# Patient Record
Sex: Male | Born: 1975 | Race: White | Hispanic: No | Marital: Married | Smoking: Never smoker
Health system: Southern US, Community
[De-identification: ages and names within clinical notes are randomized; demographics above are authoritative.]

## PROBLEM LIST (undated history)

## (undated) DIAGNOSIS — T7500XA Unspecified effects of lightning, initial encounter: Secondary | ICD-10-CM

## (undated) DIAGNOSIS — I10 Essential (primary) hypertension: Secondary | ICD-10-CM

## (undated) DIAGNOSIS — C439 Malignant melanoma of skin, unspecified: Secondary | ICD-10-CM

## (undated) DIAGNOSIS — E785 Hyperlipidemia, unspecified: Secondary | ICD-10-CM

## (undated) DIAGNOSIS — T7840XA Allergy, unspecified, initial encounter: Secondary | ICD-10-CM

## (undated) DIAGNOSIS — G40909 Epilepsy, unspecified, not intractable, without status epilepticus: Secondary | ICD-10-CM

## (undated) HISTORY — DX: Unspecified effects of lightning, initial encounter: T75.00XA

## (undated) HISTORY — DX: Hyperlipidemia, unspecified: E78.5

## (undated) HISTORY — DX: Allergy, unspecified, initial encounter: T78.40XA

## (undated) HISTORY — DX: Epilepsy, unspecified, not intractable, without status epilepticus: G40.909

## (undated) HISTORY — PX: TYMPANOSTOMY TUBE PLACEMENT: SHX32

## (undated) HISTORY — DX: Malignant melanoma of skin, unspecified: C43.9

## (undated) HISTORY — DX: Essential (primary) hypertension: I10

---

## 1999-04-16 HISTORY — PX: OTHER SURGICAL HISTORY: SHX169

## 2004-07-11 ENCOUNTER — Ambulatory Visit: Payer: Self-pay | Admitting: Internal Medicine

## 2004-07-11 ENCOUNTER — Ambulatory Visit: Payer: Self-pay | Admitting: Family Medicine

## 2004-07-16 ENCOUNTER — Encounter (INDEPENDENT_AMBULATORY_CARE_PROVIDER_SITE_OTHER): Payer: Self-pay | Admitting: Internal Medicine

## 2004-07-16 DIAGNOSIS — E119 Type 2 diabetes mellitus without complications: Secondary | ICD-10-CM

## 2004-07-16 LAB — CONVERTED CEMR LAB: Hgb A1c MFr Bld: 6.6 %

## 2004-07-21 ENCOUNTER — Ambulatory Visit: Payer: Self-pay | Admitting: Family Medicine

## 2004-09-27 ENCOUNTER — Ambulatory Visit: Payer: Self-pay | Admitting: Family Medicine

## 2004-12-14 ENCOUNTER — Encounter (INDEPENDENT_AMBULATORY_CARE_PROVIDER_SITE_OTHER): Payer: Self-pay | Admitting: Internal Medicine

## 2004-12-14 LAB — CONVERTED CEMR LAB: Hgb A1c MFr Bld: 6 %

## 2005-01-10 ENCOUNTER — Ambulatory Visit: Payer: Self-pay | Admitting: Family Medicine

## 2005-02-16 ENCOUNTER — Ambulatory Visit: Payer: Self-pay | Admitting: Family Medicine

## 2005-03-28 ENCOUNTER — Ambulatory Visit: Payer: Self-pay | Admitting: Family Medicine

## 2005-04-05 ENCOUNTER — Ambulatory Visit: Payer: Self-pay | Admitting: Family Medicine

## 2005-04-06 ENCOUNTER — Emergency Department (HOSPITAL_COMMUNITY): Admission: EM | Admit: 2005-04-06 | Discharge: 2005-04-06 | Payer: Self-pay | Admitting: Emergency Medicine

## 2005-04-08 ENCOUNTER — Emergency Department (HOSPITAL_COMMUNITY): Admission: EM | Admit: 2005-04-08 | Discharge: 2005-04-08 | Payer: Self-pay | Admitting: Family Medicine

## 2005-04-20 ENCOUNTER — Ambulatory Visit: Payer: Self-pay | Admitting: Family Medicine

## 2005-06-04 ENCOUNTER — Ambulatory Visit: Payer: Self-pay | Admitting: Internal Medicine

## 2005-08-16 ENCOUNTER — Encounter (INDEPENDENT_AMBULATORY_CARE_PROVIDER_SITE_OTHER): Payer: Self-pay | Admitting: Internal Medicine

## 2005-08-16 LAB — CONVERTED CEMR LAB: Hgb A1c MFr Bld: 6.1 %

## 2005-08-20 ENCOUNTER — Ambulatory Visit: Payer: Self-pay | Admitting: Family Medicine

## 2005-12-07 ENCOUNTER — Ambulatory Visit: Payer: Self-pay | Admitting: Family Medicine

## 2005-12-20 ENCOUNTER — Ambulatory Visit: Payer: Self-pay | Admitting: Internal Medicine

## 2006-02-18 ENCOUNTER — Encounter (INDEPENDENT_AMBULATORY_CARE_PROVIDER_SITE_OTHER): Payer: Self-pay | Admitting: Internal Medicine

## 2006-02-18 ENCOUNTER — Ambulatory Visit: Payer: Self-pay | Admitting: Family Medicine

## 2006-02-18 LAB — CONVERTED CEMR LAB: Hgb A1c MFr Bld: 6.2 %

## 2006-05-30 ENCOUNTER — Ambulatory Visit: Payer: Self-pay | Admitting: Family Medicine

## 2006-06-15 ENCOUNTER — Encounter (INDEPENDENT_AMBULATORY_CARE_PROVIDER_SITE_OTHER): Payer: Self-pay | Admitting: Internal Medicine

## 2006-06-15 LAB — CONVERTED CEMR LAB: Microalbumin U total vol: 2.1 mg/L

## 2006-06-28 ENCOUNTER — Ambulatory Visit: Payer: Self-pay | Admitting: Family Medicine

## 2006-07-08 ENCOUNTER — Ambulatory Visit: Payer: Self-pay | Admitting: Internal Medicine

## 2006-11-15 ENCOUNTER — Encounter (INDEPENDENT_AMBULATORY_CARE_PROVIDER_SITE_OTHER): Payer: Self-pay | Admitting: Internal Medicine

## 2006-11-15 DIAGNOSIS — Z8669 Personal history of other diseases of the nervous system and sense organs: Secondary | ICD-10-CM

## 2006-11-28 ENCOUNTER — Ambulatory Visit: Payer: Self-pay | Admitting: Family Medicine

## 2006-11-28 ENCOUNTER — Telehealth (INDEPENDENT_AMBULATORY_CARE_PROVIDER_SITE_OTHER): Payer: Self-pay | Admitting: *Deleted

## 2006-11-28 DIAGNOSIS — E782 Mixed hyperlipidemia: Secondary | ICD-10-CM | POA: Insufficient documentation

## 2006-11-29 LAB — CONVERTED CEMR LAB
ALT: 85 units/L — ABNORMAL HIGH (ref 0–40)
AST: 42 units/L — ABNORMAL HIGH (ref 0–37)
Cholesterol: 143 mg/dL (ref 0–200)
HDL: 37.2 mg/dL — ABNORMAL LOW (ref >39.0)
Hgb A1c MFr Bld: 7.2 % — ABNORMAL HIGH (ref 4.6–6.0)
LDL Cholesterol: 81 mg/dL (ref 0–99)
Total CHOL/HDL Ratio: 3.8
Triglycerides: 124 mg/dL (ref 0–149)
VLDL: 25 mg/dL (ref 0–40)

## 2006-12-20 ENCOUNTER — Ambulatory Visit: Payer: Self-pay | Admitting: Family Medicine

## 2006-12-27 ENCOUNTER — Ambulatory Visit: Payer: Self-pay | Admitting: Family Medicine

## 2007-01-01 LAB — CONVERTED CEMR LAB
Cholesterol: 126 mg/dL (ref 0–200)
HDL: 35.3 mg/dL — ABNORMAL LOW (ref >39.0)
LDL Cholesterol: 71 mg/dL (ref 0–99)
Total CHOL/HDL Ratio: 3.6
Triglycerides: 100 mg/dL (ref 0–149)
VLDL: 20 mg/dL (ref 0–40)

## 2007-02-27 ENCOUNTER — Ambulatory Visit: Payer: Self-pay | Admitting: Internal Medicine

## 2007-05-30 ENCOUNTER — Ambulatory Visit: Payer: Self-pay | Admitting: Family Medicine

## 2007-07-01 ENCOUNTER — Ambulatory Visit: Payer: Self-pay | Admitting: Family Medicine

## 2007-07-07 ENCOUNTER — Telehealth (INDEPENDENT_AMBULATORY_CARE_PROVIDER_SITE_OTHER): Payer: Self-pay | Admitting: Internal Medicine

## 2007-07-07 LAB — CONVERTED CEMR LAB
AST: 24 units/L (ref 0–37)
Cholesterol: 137 mg/dL (ref 0–200)
Creatinine,U: 191 mg/dL
LDL Cholesterol: 75 mg/dL (ref 0–99)
Microalb Creat Ratio: 3.7 mg/g (ref 0.0–30.0)
Microalb, Ur: 0.7 mg/dL (ref 0.0–1.9)
VLDL: 22 mg/dL (ref 0–40)

## 2007-08-26 ENCOUNTER — Ambulatory Visit: Payer: Self-pay | Admitting: Family Medicine

## 2007-11-17 ENCOUNTER — Ambulatory Visit: Payer: Self-pay | Admitting: Family Medicine

## 2007-11-19 LAB — CONVERTED CEMR LAB
ALT: 28 units/L (ref 0–53)
AST: 23 units/L (ref 0–37)
Calcium: 9.6 mg/dL (ref 8.4–10.5)
Creatinine, Ser: 0.9 mg/dL (ref 0.4–1.5)
GFR calc Af Amer: 124 mL/min
GFR calc non Af Amer: 103 mL/min
HDL: 39.7 mg/dL (ref >39.0)
LDL Cholesterol: 73 mg/dL (ref 0–99)
Sodium: 140 meq/L (ref 135–145)
Total CHOL/HDL Ratio: 3.2
VLDL: 16 mg/dL (ref 0–40)

## 2007-12-03 ENCOUNTER — Encounter (INDEPENDENT_AMBULATORY_CARE_PROVIDER_SITE_OTHER): Payer: Self-pay | Admitting: Internal Medicine

## 2007-12-22 ENCOUNTER — Ambulatory Visit: Payer: Self-pay | Admitting: Family Medicine

## 2008-02-24 ENCOUNTER — Ambulatory Visit: Payer: Self-pay | Admitting: Family Medicine

## 2008-05-18 ENCOUNTER — Ambulatory Visit: Payer: Self-pay | Admitting: Family Medicine

## 2008-05-20 LAB — CONVERTED CEMR LAB
AST: 19 units/L (ref 0–37)
Alkaline Phosphatase: 32 units/L — ABNORMAL LOW (ref 39–117)
LDL Cholesterol: 78 mg/dL (ref 0–99)
Total Bilirubin: 0.7 mg/dL (ref 0.3–1.2)
Total CHOL/HDL Ratio: 3.3

## 2008-06-23 ENCOUNTER — Ambulatory Visit: Payer: Self-pay | Admitting: Family Medicine

## 2008-06-24 LAB — CONVERTED CEMR LAB
Hgb A1c MFr Bld: 6.4 % — ABNORMAL HIGH (ref 4.6–6.0)
Microalb Creat Ratio: 3.2 mg/g (ref 0.0–30.0)

## 2008-12-28 ENCOUNTER — Ambulatory Visit: Payer: Self-pay | Admitting: Family Medicine

## 2008-12-30 ENCOUNTER — Telehealth (INDEPENDENT_AMBULATORY_CARE_PROVIDER_SITE_OTHER): Payer: Self-pay | Admitting: Internal Medicine

## 2008-12-30 LAB — CONVERTED CEMR LAB
AST: 24 units/L (ref 0–37)
CO2: 29 meq/L (ref 19–32)
Calcium: 9.6 mg/dL (ref 8.4–10.5)
Chloride: 106 meq/L (ref 96–112)
Cholesterol: 146 mg/dL (ref 0–200)
Creatinine, Ser: 1 mg/dL (ref 0.4–1.5)
Direct LDL: 74.6 mg/dL
Glucose, Bld: 127 mg/dL — ABNORMAL HIGH (ref 70–99)
HDL: 38.5 mg/dL — ABNORMAL LOW (ref >39.00)
Sodium: 140 meq/L (ref 135–145)
VLDL: 44.4 mg/dL — ABNORMAL HIGH (ref 0.0–40.0)

## 2009-01-03 ENCOUNTER — Encounter (INDEPENDENT_AMBULATORY_CARE_PROVIDER_SITE_OTHER): Payer: Self-pay | Admitting: Internal Medicine

## 2009-07-07 ENCOUNTER — Ambulatory Visit: Payer: Self-pay | Admitting: Family Medicine

## 2009-07-12 ENCOUNTER — Ambulatory Visit: Payer: Self-pay | Admitting: Family Medicine

## 2009-07-13 ENCOUNTER — Telehealth: Payer: Self-pay | Admitting: Internal Medicine

## 2009-07-13 ENCOUNTER — Encounter (INDEPENDENT_AMBULATORY_CARE_PROVIDER_SITE_OTHER): Payer: Self-pay | Admitting: Internal Medicine

## 2009-07-13 LAB — CONVERTED CEMR LAB
ALT: 51 units/L (ref 0–53)
CO2: 29 meq/L (ref 19–32)
Chloride: 104 meq/L (ref 96–112)
Cholesterol: 136 mg/dL (ref 0–200)
Creatinine,U: 170.6 mg/dL
HDL: 36.7 mg/dL — ABNORMAL LOW (ref >39.00)
LDL Cholesterol: 66 mg/dL (ref 0–99)
Microalb Creat Ratio: 2.3 mg/g (ref 0.0–30.0)
Potassium: 4.7 meq/L (ref 3.5–5.1)
Sodium: 139 meq/L (ref 135–145)
TSH: 1.72 microintl units/mL (ref 0.35–5.50)
VLDL: 33.6 mg/dL (ref 0.0–40.0)

## 2009-08-30 ENCOUNTER — Ambulatory Visit: Payer: Self-pay | Admitting: Family Medicine

## 2009-08-30 LAB — CONVERTED CEMR LAB: Hgb A1c MFr Bld: 6.5 % (ref 4.6–6.5)

## 2010-01-20 ENCOUNTER — Ambulatory Visit: Payer: Self-pay | Admitting: Family Medicine

## 2010-01-23 LAB — CONVERTED CEMR LAB
ALT: 43 units/L (ref 0–53)
AST: 29 units/L (ref 0–37)
Alkaline Phosphatase: 37 units/L — ABNORMAL LOW (ref 39–117)
Bilirubin, Direct: 0.1 mg/dL (ref 0.0–0.3)
CO2: 30 meq/L (ref 19–32)
Chloride: 107 meq/L (ref 96–112)
Cholesterol: 156 mg/dL (ref 0–200)
Creatinine, Ser: 1 mg/dL (ref 0.4–1.5)
Direct LDL: 78.7 mg/dL
Potassium: 5 meq/L (ref 3.5–5.1)
Sodium: 142 meq/L (ref 135–145)
Total Bilirubin: 0.9 mg/dL (ref 0.3–1.2)
Total CHOL/HDL Ratio: 4
Total Protein: 7.5 g/dL (ref 6.0–8.3)
VLDL: 58 mg/dL — ABNORMAL HIGH (ref 0.0–40.0)

## 2010-01-26 ENCOUNTER — Encounter: Payer: Self-pay | Admitting: Family Medicine

## 2010-04-25 ENCOUNTER — Ambulatory Visit: Payer: Self-pay | Admitting: Family Medicine

## 2010-04-25 DIAGNOSIS — J309 Allergic rhinitis, unspecified: Secondary | ICD-10-CM

## 2010-05-08 ENCOUNTER — Encounter: Payer: Self-pay | Admitting: Family Medicine

## 2010-07-24 ENCOUNTER — Ambulatory Visit
Admission: RE | Admit: 2010-07-24 | Discharge: 2010-07-24 | Payer: Self-pay | Source: Home / Self Care | Attending: Family Medicine | Admitting: Family Medicine

## 2010-07-24 ENCOUNTER — Other Ambulatory Visit: Payer: Self-pay | Admitting: Family Medicine

## 2010-07-24 DIAGNOSIS — R12 Heartburn: Secondary | ICD-10-CM | POA: Insufficient documentation

## 2010-07-24 DIAGNOSIS — I10 Essential (primary) hypertension: Secondary | ICD-10-CM | POA: Insufficient documentation

## 2010-07-24 LAB — LDL CHOLESTEROL, DIRECT: Direct LDL: 96.1 mg/dL

## 2010-07-24 LAB — LIPID PANEL
Cholesterol: 161 mg/dL (ref 0–200)
HDL: 35.8 mg/dL — ABNORMAL LOW (ref >39.00)
Total CHOL/HDL Ratio: 4
Triglycerides: 232 mg/dL — ABNORMAL HIGH (ref 0.0–149.0)
VLDL: 46.4 mg/dL — ABNORMAL HIGH (ref 0.0–40.0)

## 2010-07-24 LAB — HEMOGLOBIN A1C: Hgb A1c MFr Bld: 6.8 % — ABNORMAL HIGH (ref 4.6–6.5)

## 2010-08-17 NOTE — Assessment & Plan Note (Signed)
Summary: ROA FOR 6 MONTH FOLLOW-UP/JRR   Vital Signs:  Patient profile:   35 year old male Height:      66 inches Weight:      205 pounds BMI:     33.21 Temp:     98.1 degrees F oral Pulse rate:   72 / minute Pulse rhythm:   regular BP sitting:   102 / 78  (left arm) Cuff size:   large  Vitals Entered By: Delilah Shan CMA Raesha Coonrod Dull) (July 24, 2010 8:11 AM) CC: 6 months follow up   History of Present Illness: Noted some GERD symptoms after certain foods, better with tums.  Also has some post nasal gtt at bedtime.    Walking some during the week.  Down 3 lbs since last summer.    Hypertension:      Using medication without problems or lightheadedness: yes Chest pain with exertion:no Edema:no Short of breath:no Average home BPs:not checked Other issues: no  Diabetes:  Using medications without difficulties:yes Hypoglycemic episodes: rare Hyperglycemic episodes:no Feet problems:no Blood Sugars averaging: 90-140s in AM eye exam within last year: yes  Elevated Cholesterol: Using medications without problems:yes Muscle aches: no Other complaints: no  Allergies: 1)  ! * Rubbing Alcohol  Past History:  Past Medical History: Last updated: 04/25/2010 Hyperlipidemia Diabetes mellitus, type II (07/16/2004) Seizure disorder--age 38 but no subsequent seizures, no meds since before 1st grade H/o fall and spring allergic rhinitis  Review of Systems       See HPI.  Otherwise negative.    Physical Exam  General:  GEN: nad, alert and oriented HEENT: mucous membranes moist, TM wnl, OP wnl, nasal exam w/o erythema NECK: supple w/o LA CV: rrr.  no murmur PULM: ctab, no inc wob ABD: soft, +bs EXT: no edema SKIN: no acute rash    Diabetes Management Exam:    Foot Exam (with socks and/or shoes not present):       Sensory-Pinprick/Light touch:          Left medial foot (L-4): normal          Left dorsal foot (L-5): normal          Left lateral foot (S-1): normal       Right medial foot (L-4): normal          Right dorsal foot (L-5): normal          Right lateral foot (S-1): normal       Sensory-Monofilament:          Left foot: normal          Right foot: normal       Inspection:          Left foot: normal          Right foot: normal       Nails:          Left foot: normal          Right foot: normal   Impression & Recommendations:  Problem # 1:  HYPERLIPIDEMIA, MIXED (ICD-272.2) Check labs.  No change in meds now.  His updated medication list for this problem includes:    Vytorin 10-40 Mg Tabs (Ezetimibe-simvastatin) .Marland Kitchen... 1 tablet every day  Problem # 2:  DIABETES MELLITUS, TYPE II (ICD-250.00) Check labs.  No change in meds now.  D/w patient re: weight and exercise along with low glycemic foods.  His updated medication list for this problem includes:    Lisinopril 10 Mg  Tabs (Lisinopril) .Marland Kitchen... 1 once daily for bp and dm    Metformin Hcl 500 Mg Tabs (Metformin hcl) .Marland Kitchen... Take 1 at breakfast for diabetes  Orders: TLB-Lipid Panel (80061-LIPID) TLB-A1C / Hgb A1C (Glycohemoglobin) (83036-A1C)  Problem # 3:  HYPERTENSION, BENIGN (ICD-401.1) Check labs.  No change in meds now.  His updated medication list for this problem includes:    Lisinopril 10 Mg Tabs (Lisinopril) .Marland Kitchen... 1 once daily for bp and dm  Problem # 4:  HEARTBURN (ICD-787.1) continue tums and work on weight.  He'll elevate the head of bed and see if this helps both the heartburn and the post nasal gtt which is likely due to allergies.    Complete Medication List: 1)  Vytorin 10-40 Mg Tabs (Ezetimibe-simvastatin) .Marland Kitchen.. 1 tablet every day 2)  Alavert Tabs (Loratadine tabs) .Marland Kitchen.. 1 once daily as needed 3)  Fish Oil 1000 Mg Caps (Omega-3 fatty acids) .... Take 1 capsule by mouth four times a day 4)  Garlic Tabs (Garlic tabs) .... Take 1 tablet by mouth once a day 5)  Daily Multiple Vitamins Tabs (Multiple vitamin) .... Take 1 tablet by mouth once a day 6)  Lisinopril 10 Mg Tabs  (Lisinopril) .Marland Kitchen.. 1 once daily for bp and dm 7)  Metformin Hcl 500 Mg Tabs (Metformin hcl) .... Take 1 at breakfast for diabetes  Patient Instructions: 1)  We'll contact you with your lab report.  2)  Don't change your meds in the meantime. 3)  I'm glad you are walking to work on losing weight.   4)  Elevate the head of your bed and see if that helps with your sleep. 5)  Please schedule a follow up appointment in 6 months.  OV.  Take care.    Orders Added: 1)  Est. Patient Level IV [56387] 2)  TLB-Lipid Panel [80061-LIPID] 3)  TLB-A1C / Hgb A1C (Glycohemoglobin) [83036-A1C]    Current Allergies (reviewed today): ! * RUBBING ALCOHOL   Preventive Care Screening  Last Flu Shot:    Date:  04/18/2010    Results:  given

## 2010-08-17 NOTE — Assessment & Plan Note (Signed)
Summary: DRAINAGE,COUGH/CLE   Vital Signs:  Patient profile:   35 year old male Height:      66 inches Weight:      206.50 pounds BMI:     33.45 Temp:     97.7 degrees F oral Pulse rate:   76 / minute Pulse rhythm:   regular BP sitting:   140 / 92  (left arm) Cuff size:   regular  Vitals Entered By: Linde Gillis CMA Duncan Dull) (April 25, 2010 2:57 PM) CC: cough, drainage   History of Present Illness: Took flu shot on 4th.  Thursday cough started.  Drainage over the weekend.  Better today.  "It still feels like it's in my ears."  Puffy under eyes.  No fevers.  Not aching.  Has trouble with fall allergies.  Sugar had been in the 120s in AM recently, as low as 80s.  Most troubled by cough at night due to post nasal gtt.    Allergies: 1)  ! * Rubbing Alcohol  Past History:  Past Medical History: Hyperlipidemia Diabetes mellitus, type II (07/16/2004) Seizure disorder--age 52 but no subsequent seizures, no meds since before 1st grade H/o fall and spring allergic rhinitis  Review of Systems       See HPI.  Otherwise negative.    Physical Exam  General:  GEN: nad, alert and oriented HEENT: mucous membranes moist, TM w/o erythema, nasal epithelium injected and stuffy, OP without cobblestoning NECK: supple w/o LA CV: rrr. PULM: ctab, no inc wob ABD: soft, +bs EXT: no edema    Impression & Recommendations:  Problem # 1:  ALLERGIC RHINITIS (ICD-477.9) Likely allergic flare.  I doubt infectious cause or relation to flu shot.  d/w pt.  No indication for abx based on hx/exam.  Sample given of veramyst, 1 spray two times a day.  use that in AM and otc spray at night.  continue alavert.  Call back as needed.  He may need spring and fall coverage with nasal steroid.  I d/w patient possible nosebleeds and possible increase in glucose, but I think the chance of either is small. He understood.  1st dose of spray done here in clinic.  His updated medication list for this problem  includes:    Alavert Tabs (Loratadine tabs) .Marland Kitchen... 1 once daily as needed  Complete Medication List: 1)  Vytorin 10-40 Mg Tabs (Ezetimibe-simvastatin) .Marland Kitchen.. 1 tablet every day 2)  Alavert Tabs (Loratadine tabs) .Marland Kitchen.. 1 once daily as needed 3)  Fish Oil 1000 Mg Caps (Omega-3 fatty acids) .... Take 1 capsule by mouth four times a day 4)  Garlic Tabs (Garlic tabs) .... Take 1 tablet by mouth once a day 5)  Daily Multiple Vitamins Tabs (Multiple vitamin) .... Take 1 tablet by mouth once a day 6)  Lisinopril 10 Mg Tabs (Lisinopril) .Marland Kitchen.. 1 once daily for bp and dm 7)  Metformin Hcl 500 Mg Tabs (Metformin hcl) .... Take 1 at breakfast for diabetes  Patient Instructions: 1)  Use the over the counter spray at night and the veramyst in the morning.  Keep using the alavert and call me if you aren't getting better.  Glad to see you today.   Current Allergies (reviewed today): ! * RUBBING ALCOHOL

## 2010-08-17 NOTE — Letter (Signed)
Summary: Letter Regarding Disease Mgmt Program/[facility]  Letter Regarding Disease Mgmt Program/[facility]   Imported By: Lanelle Bal 05/16/2010 15:15:35  _____________________________________________________________________  External Attachment:    Type:   Image     Comment:   External Document

## 2010-08-17 NOTE — Assessment & Plan Note (Signed)
Summary: 6 M F/U PER BILLIE Dewayne Hatch  LABS SAMEDAY/DLO   Vital Signs:  Patient profile:   35 year old male Height:      66 inches Weight:      208.25 pounds BMI:     33.73 Temp:     97.9 degrees F oral Pulse rate:   76 / minute Pulse rhythm:   regular BP sitting:   122 / 90  (left arm) Cuff size:   large  Vitals Entered By: Delilah Shan CMA (AAMA) (January 20, 2010 8:02 AM) CC: 6 months follow up per [facility] - Labs   History of Present Illness: Diabetes: 2 year hx.  Using medications without difficulties:yes Hypoglycemic episodes: occ if prolonged fasting.  Hyperglycemic episodes:no Feet problems:no Blood Sugars averaging: rarely checked eye exam within last year: yes- 12/28/09 and normal exam tolerating ACE w/o complication  Elevated Cholesterol: Using medications without problems: yes Muscle aches: no Other complaints: no  exercise: working to start back walking  Allergies: 1)  ! * Rubbing Alcohol  Past History:  Social History: Last updated: 01/20/2010 Marital Status: Married Children: 2 daughters Occupation: Personnel officer with [employer]  Past Medical History: Hyperlipidemia Diabetes mellitus, type II (07/16/2004) Seizure disorder--age 68 but no subsequent seizures, no meds since before 1st grade  Family History: M A with DM2, chol F A with chol  Social History: Marital Status: Married Children: 2 daughters Occupation: Personnel officer with [employer]  Review of Systems       See HPI.  Otherwise noncontributory.    Physical Exam  General:  GEN: nad, alert and oriented HEENT: mucous membranes moist NECK: supple w/o LA CV: rrr.  no murmur PULM: ctab, no inc wob ABD: soft, +bs EXT: no edema SKIN: no acute rash   Diabetes Management Exam:    Foot Exam (with socks and/or shoes not present):       Sensory-Pinprick/Light touch:          Left medial foot (L-4): normal          Left dorsal foot (L-5): normal          Left lateral foot  (S-1): normal          Right medial foot (L-4): normal          Right dorsal foot (L-5): normal          Right lateral foot (S-1): normal       Sensory-Monofilament:          Left foot: normal       Inspection:          Left foot: normal       Nails:          Left foot: normal          Right foot: normal    Eye Exam:       Eye Exam done elsewhere          Date: 12/28/2009          Results: normal          Done by: ophtho   Impression & Recommendations:  Problem # 1:  HYPERLIPIDEMIA, MIXED (ICD-272.2) Contact with labs.  No change in meds in the meantime.  Increase exercise.  D/w patient re: weight.  His updated medication list for this problem includes:    Vytorin 10-40 Mg Tabs (Ezetimibe-simvastatin) .Marland Kitchen... 1 tablet every day  Problem # 2:  DIABETES MELLITUS, TYPE II (ICD-250.00) continue exercise.  No  change in meds (unless needed after labs resulted).   His updated medication list for this problem includes:    Lisinopril 10 Mg Tabs (Lisinopril) .Marland Kitchen... 1 once daily for bp and dm    Metformin Hcl 500 Mg Tabs (Metformin hcl) .Marland Kitchen... Take 1 at breakfast for diabetes  Orders: TLB-BMP (Basic Metabolic Panel-BMET) (80048-METABOL) TLB-Hepatic/Liver Function Pnl (80076-HEPATIC) TLB-A1C / Hgb A1C (Glycohemoglobin) (83036-A1C) TLB-Lipid Panel (80061-LIPID)  Complete Medication List: 1)  Vytorin 10-40 Mg Tabs (Ezetimibe-simvastatin) .Marland Kitchen.. 1 tablet every day 2)  Alavert Tabs (Loratadine tabs) .Marland Kitchen.. 1 once daily as needed 3)  Fish Oil 1000 Mg Caps (Omega-3 fatty acids) .... Take 1 capsule by mouth four times a day 4)  Garlic Tabs (Garlic tabs) .... Take 1 tablet by mouth once a day 5)  Daily Multiple Vitamins Tabs (Multiple vitamin) .... Take 1 tablet by mouth once a day 6)  Lisinopril 10 Mg Tabs (Lisinopril) .Marland Kitchen.. 1 once daily for bp and dm 7)  Metformin Hcl 500 Mg Tabs (Metformin hcl) .... Take 1 at breakfast for diabetes  Patient Instructions: 1)  Please schedule a follow-up  appointment in 6 months .  Come in fasting for that appointment.  Keeping working on your exercise and diet.  I'll send word about your labs.  Take care.   Current Allergies (reviewed today): ! * RUBBING ALCOHOL

## 2010-08-17 NOTE — Letter (Signed)
Summary: [facility]   Imported By: Lanelle Bal 02/03/2010 11:36:07  _____________________________________________________________________  External Attachment:    Type:   Image     Comment:   External Document  Appended Document: [facility]    Clinical Lists Changes        Diabetes Management History:      He says that he is exercising.  Type of exercise includes: walks .  Duration of exercise is estimated to be 30-60  He is doing this 3 times per week.

## 2010-10-16 ENCOUNTER — Ambulatory Visit (INDEPENDENT_AMBULATORY_CARE_PROVIDER_SITE_OTHER): Payer: Self-pay | Admitting: Family Medicine

## 2010-10-16 ENCOUNTER — Encounter: Payer: Self-pay | Admitting: Family Medicine

## 2010-10-16 VITALS — BP 124/84 | HR 76 | Temp 97.3°F | Ht 66.25 in | Wt 217.0 lb

## 2010-10-16 DIAGNOSIS — J019 Acute sinusitis, unspecified: Secondary | ICD-10-CM | POA: Insufficient documentation

## 2010-10-16 MED ORDER — AMOXICILLIN 875 MG PO TABS: 875.0000 mg | ORAL_TABLET | Freq: Two times a day (BID) | ORAL | Status: DC

## 2010-10-16 NOTE — Progress Notes (Signed)
Wife had an anxiety attack over the weekend and the patient had to take her to the ER.  Since then he was having itchy eyes and chills.  Saturday AM he had L maxillary pain.  L ear pain since then.  Used some sweet oil with some relief.  Ears feel clogged. Pain with chewing on L side- pain at TMJ.  No fevers.  H/o PE tubes.    Meds, vitals, and allergies reviewed.   ROS: See HPI.  Otherwise, noncontributory.  nad ncat b cerumen impaction removed and TMs wnl L TMJ ttp L max sinus ttp Nasal exam with erythema and puffy turbinates Op with cobblestoning.  Mmm Neck supple w/o LA

## 2010-10-16 NOTE — Assessment & Plan Note (Addendum)
Start amoxil and rest the TMJ.  Supportive tx o/w and NSAIDS caution given. He understood.  Fu prn.

## 2010-10-16 NOTE — Patient Instructions (Signed)
Drink plenty of fluids, take ibuprofen (with food) as needed, and avoid tough food that you have to work to chew.  This should gradually improve.  Take care.  Let us know if you have other concerns.   Glad to see you today.

## 2010-10-17 ENCOUNTER — Encounter: Payer: Self-pay | Admitting: Family Medicine

## 2011-01-18 ENCOUNTER — Encounter: Payer: Self-pay | Admitting: Family Medicine

## 2011-01-26 ENCOUNTER — Ambulatory Visit (INDEPENDENT_AMBULATORY_CARE_PROVIDER_SITE_OTHER): Payer: BC Managed Care – PPO | Admitting: Family Medicine

## 2011-01-26 ENCOUNTER — Encounter: Payer: Self-pay | Admitting: Family Medicine

## 2011-01-26 VITALS — BP 120/82 | HR 80 | Temp 98.3°F | Wt 207.0 lb

## 2011-01-26 DIAGNOSIS — I10 Essential (primary) hypertension: Secondary | ICD-10-CM

## 2011-01-26 DIAGNOSIS — E119 Type 2 diabetes mellitus without complications: Secondary | ICD-10-CM

## 2011-01-26 DIAGNOSIS — E782 Mixed hyperlipidemia: Secondary | ICD-10-CM

## 2011-01-26 DIAGNOSIS — Z8669 Personal history of other diseases of the nervous system and sense organs: Secondary | ICD-10-CM

## 2011-01-26 LAB — COMPREHENSIVE METABOLIC PANEL
BUN: 12 mg/dL (ref 6–23)
CO2: 26 mEq/L (ref 19–32)
Calcium: 9.2 mg/dL (ref 8.4–10.5)
Chloride: 105 mEq/L (ref 96–112)
Creatinine, Ser: 0.9 mg/dL (ref 0.4–1.5)
GFR: 101.95 mL/min (ref >60.00)

## 2011-01-26 LAB — HEMOGLOBIN A1C: Hgb A1c MFr Bld: 6.9 % — ABNORMAL HIGH (ref 4.6–6.5)

## 2011-01-26 NOTE — Progress Notes (Signed)
Diabetes:  Using medications without difficulties:yes Hypoglycemic episodes:no Hyperglycemic episodes:no Feet problems:no Blood Sugars averaging: not checked eye exam within last year:yes, June 15th  Hypertension:    Using medication without problems or lightheadedness: yes Chest pain with exertion:no Edema:no Short of breath:no Average home BPs: not checked  Elevated Cholesterol: Using medications without problems:yes Muscle aches: no Other complaints:no  Walking for exercise and working on diet.    Nasal sx, seasonal allergies, taking alavert.   PMH and SH reviewed.   Vital signs, Meds and allergies reviewed.  ROS: See HPI.  Otherwise nontributory.   GEN: nad, alert and oriented HEENT: mucous membranes moist, tm wnl, nasal epithelium minimally injected, op wnl NECK: supple w/o LA CV: rrr.  no murmur PULM: ctab, no inc wob ABD: soft, +bs EXT: no edema SKIN: no acute rash  Diabetic foot exam: Normal inspection No skin breakdown No calluses  Normal DP pulses Normal sensation to light tough and monofilament Nails normal

## 2011-01-26 NOTE — Patient Instructions (Addendum)
Keep using nasal saline and the alavert for your allergies.  Keep walking and working on your diet.  You can get your results through our phone system.  Follow the instructions on the blue card. Come back for labs in 6 months ahead of a OV with me.  Take care. I would get a flu shot each fall.   Glad to see you.

## 2011-01-28 NOTE — Assessment & Plan Note (Signed)
No change in meds, see notes on labs.  D/w pt about diet, exercise, weight.  See instructions.

## 2011-01-28 NOTE — Assessment & Plan Note (Signed)
No change in meds, see notes on labs.  D/w pt about diet, exercise, weight.  See instructions.  

## 2011-02-01 ENCOUNTER — Telehealth: Payer: Self-pay | Admitting: *Deleted

## 2011-02-01 MED ORDER — METFORMIN HCL 500 MG PO TABS: 500.0000 mg | ORAL_TABLET | Freq: Every day | ORAL | Status: DC

## 2011-02-01 MED ORDER — LISINOPRIL 10 MG PO TABS: 10.0000 mg | ORAL_TABLET | Freq: Every day | ORAL | Status: DC

## 2011-02-01 MED ORDER — SIMVASTATIN 40 MG PO TABS: 40.0000 mg | ORAL_TABLET | Freq: Every day | ORAL | Status: DC

## 2011-02-01 NOTE — Telephone Encounter (Signed)
Switch to generic simvastatin.  I want him to continue to work on diet and weight.  Thanks.

## 2011-02-01 NOTE — Telephone Encounter (Signed)
Patient is asking if he could get a generic for vytorin. He brought in a list of generics that were recommended to him. Atorvastatin, lovastatin, simvastatin, and pravastatin.

## 2011-02-02 NOTE — Telephone Encounter (Signed)
Patient notified

## 2011-05-08 ENCOUNTER — Encounter: Payer: Self-pay | Admitting: Family Medicine

## 2011-05-08 ENCOUNTER — Ambulatory Visit (INDEPENDENT_AMBULATORY_CARE_PROVIDER_SITE_OTHER): Payer: BC Managed Care – PPO | Admitting: Family Medicine

## 2011-05-08 DIAGNOSIS — Z630 Problems in relationship with spouse or partner: Secondary | ICD-10-CM

## 2011-05-08 DIAGNOSIS — J309 Allergic rhinitis, unspecified: Secondary | ICD-10-CM

## 2011-05-08 MED ORDER — FLUTICASONE PROPIONATE 50 MCG/ACT NA SUSP: 1.0000 | Freq: Every day | NASAL | Status: DC

## 2011-05-08 NOTE — Patient Instructions (Signed)
I would use the flonase 1-2 sprays in each nostril each day.  Keep using the nasal saline.   Use talc and rotate your shoes and socks.   If you aren't getting better, let me know.

## 2011-05-08 NOTE — Progress Notes (Signed)
"  it feels like I got something in my ears."  No facial pain.   duration of symptoms: a few weeks Rhinorrhea: some Congestion: some ear pain: no, but feels 'full' sore throat: no Cough: some, clearing throat Myalgias: no other concerns:no fevers.   Had a flu shot 04/19/11.   Sugars have been up last few weeks.    Separated from wife for last 3 weeks.  "she said I wasn't talking to her enough and we had some problems to work out."  He's living with parents now.  Plans to get back together.  He's looking after his 2 daughters a few nights a week.  Tired, tearful, sleeping okay.  No SI/HI.  He was looking forward to a promotion at work and he still enjoys work.    ROS: See HPI.  Otherwise negative.    Meds, vitals, and allergies reviewed.   GEN: nad, alert and oriented HEENT: mucous membranes moist, TM w/o erythema, nasal epithelium mildly injected, OP with mild cobblestoning, sinuses not ttp NECK: supple w/o LA CV: rrr. PULM: ctab, no inc wob ABD: soft, +bs EXT: no edema Tearful but regains composure.

## 2011-05-09 ENCOUNTER — Encounter: Payer: Self-pay | Admitting: Family Medicine

## 2011-05-09 DIAGNOSIS — Z630 Problems in relationship with spouse or partner: Secondary | ICD-10-CM | POA: Insufficient documentation

## 2011-05-09 NOTE — Assessment & Plan Note (Signed)
Likely allergic source, I'd start nasal steroid and f/u prn.  Continue nasal saline.  Likely not infectious.

## 2011-05-09 NOTE — Assessment & Plan Note (Signed)
No SI/HI, I encouraged counseling.  He's trying to work this out with his wife.  He'll call back as needed.

## 2011-07-22 ENCOUNTER — Other Ambulatory Visit: Payer: Self-pay | Admitting: Family Medicine

## 2011-07-22 DIAGNOSIS — E119 Type 2 diabetes mellitus without complications: Secondary | ICD-10-CM

## 2011-07-27 ENCOUNTER — Other Ambulatory Visit (INDEPENDENT_AMBULATORY_CARE_PROVIDER_SITE_OTHER): Payer: BC Managed Care – PPO

## 2011-07-27 DIAGNOSIS — E119 Type 2 diabetes mellitus without complications: Secondary | ICD-10-CM

## 2011-07-27 LAB — HEMOGLOBIN A1C: Hgb A1c MFr Bld: 7.7 % — ABNORMAL HIGH (ref 4.6–6.5)

## 2011-07-27 LAB — COMPREHENSIVE METABOLIC PANEL
Albumin: 4.1 g/dL (ref 3.5–5.2)
Alkaline Phosphatase: 34 U/L — ABNORMAL LOW (ref 39–117)
BUN: 15 mg/dL (ref 6–23)
CO2: 28 mEq/L (ref 19–32)
GFR: 99.1 mL/min (ref >60.00)
Glucose, Bld: 149 mg/dL — ABNORMAL HIGH (ref 70–99)
Potassium: 4.4 mEq/L (ref 3.5–5.1)
Sodium: 140 mEq/L (ref 135–145)
Total Protein: 6.9 g/dL (ref 6.0–8.3)

## 2011-07-27 LAB — LIPID PANEL
Cholesterol: 160 mg/dL (ref 0–200)
HDL: 42.5 mg/dL (ref >39.00)
VLDL: 34.6 mg/dL (ref 0.0–40.0)

## 2011-07-31 ENCOUNTER — Other Ambulatory Visit: Payer: BC Managed Care – PPO

## 2011-07-31 ENCOUNTER — Encounter: Payer: Self-pay | Admitting: Family Medicine

## 2011-07-31 ENCOUNTER — Ambulatory Visit (INDEPENDENT_AMBULATORY_CARE_PROVIDER_SITE_OTHER): Payer: BC Managed Care – PPO | Admitting: Family Medicine

## 2011-07-31 ENCOUNTER — Telehealth: Payer: Self-pay | Admitting: Internal Medicine

## 2011-07-31 DIAGNOSIS — R1011 Right upper quadrant pain: Secondary | ICD-10-CM | POA: Insufficient documentation

## 2011-07-31 DIAGNOSIS — J309 Allergic rhinitis, unspecified: Secondary | ICD-10-CM

## 2011-07-31 DIAGNOSIS — E782 Mixed hyperlipidemia: Secondary | ICD-10-CM

## 2011-07-31 DIAGNOSIS — I10 Essential (primary) hypertension: Secondary | ICD-10-CM

## 2011-07-31 DIAGNOSIS — E119 Type 2 diabetes mellitus without complications: Secondary | ICD-10-CM

## 2011-07-31 MED ORDER — FLUTICASONE PROPIONATE 50 MCG/ACT NA SUSP: 1.0000 | Freq: Every day | NASAL | Status: DC

## 2011-07-31 MED ORDER — METFORMIN HCL 500 MG PO TABS: ORAL_TABLET | ORAL | Status: DC

## 2011-07-31 NOTE — Assessment & Plan Note (Signed)
Improved with current meds. No change in meds.

## 2011-07-31 NOTE — Progress Notes (Signed)
He's back at home with wife.  The home situation is improved.  Flonase has helped with nasal congestion and ear pressure.  Occ right sided back pain and RUQ cramping after eating a large meal.  Episodic.  No sx this AM after breakfast.  He'll monitor this and let me know.    Diabetes:  Using medications without difficulties:yes Hypoglycemic episodes:no Hyperglycemic episodes:no Feet problems:no Blood Sugars averaging: 130-150s before eating eye exam within last year: yes  Hypertension:    Using medication without problems or lightheadedness: yes Chest pain with exertion:no Edema:no Short of breath:no Average home BPs: not checked.   Elevated Cholesterol: Using medications without problems:yes Muscle aches: no Diet compliance: "some days are better than others" Exercise:yes, went hiking this weekend, walking during the week.   On 500mg  of metformin a day.    PMH and SH reviewed.   Vital signs, Meds and allergies reviewed.  ROS: See HPI.  Otherwise nontributory.   GEN: nad, alert and oriented HEENT: mucous membranes moist NECK: supple w/o LA CV: rrr.   PULM: ctab, no inc wob ABD: soft, +bs, ruq not ttp BACK: no areas ttp  EXT: no edema SKIN: no acute rash  Diabetic foot exam: Normal inspection No skin breakdown calluses noted on 1st toe B Normal DP pulses Normal sensation to light tough and monofilament Nails normal

## 2011-07-31 NOTE — Assessment & Plan Note (Signed)
Inc metformin, work on [employer] and diet, walk more.  D/w pt about possible GI sx from higher dose met. Recheck in 6 months.  Call back as needed.

## 2011-07-31 NOTE — Assessment & Plan Note (Addendum)
Benign exam, will follow.  Limit overeating and call back as needed.    Addendum- we had talked about observation vs RUQ u/s.  He had elected for observation, but then changed his mind and called back.  Will proceed with u/s.

## 2011-07-31 NOTE — Assessment & Plan Note (Signed)
Controlled, no change in meds. Labs d/w pt.  

## 2011-07-31 NOTE — Telephone Encounter (Signed)
Patient wants a [place] for a gallbladder.  He was seen this morning and he states it was mentioned but he would like for you to call him.  I informed him you were seeing patients and didn't know if you would have a chance but I would give him the message.    [phone]

## 2011-07-31 NOTE — Assessment & Plan Note (Signed)
Controlled except for TGs, work on diet and weight .

## 2011-07-31 NOTE — Patient Instructions (Signed)
Keep walking, stay away from sweets, increase metformin up to 4 pills a day over the next few weeks.  Come back for nonfasting labs in 6 months with OV a few days later.   Take care.

## 2011-08-01 ENCOUNTER — Telehealth: Payer: Self-pay | Admitting: Family Medicine

## 2011-08-01 NOTE — Telephone Encounter (Signed)
I believe the order is in.  Shirlee Limerick, let me know if you need anything else.  I told pt to call back in a few days if he hadn't heard about the u/s getting scheduled. Thanks to all involved.

## 2011-08-01 NOTE — Telephone Encounter (Signed)
Patient called office asking to speak to Dr. Para March in regards to scheduling an ultrasound to have his gallbladder checked. He doesn't know if he has to be referred to specialist or not. He can be contacted at [phone]

## 2011-08-01 NOTE — Telephone Encounter (Signed)
Spoke to patient and was advised that he wants you to go ahead and scheduled him for an ultrasound of his gallbladder. Patient states that this was discussed at his last office visit.

## 2011-08-01 NOTE — Telephone Encounter (Signed)
Order is in.

## 2011-08-06 ENCOUNTER — Ambulatory Visit
Admission: RE | Admit: 2011-08-06 | Discharge: 2011-08-06 | Disposition: A | Discharge status: Home / Self Care | Payer: BC Managed Care – PPO | Source: Ambulatory Visit | Attending: Family Medicine | Admitting: Family Medicine

## 2011-08-06 DIAGNOSIS — R1011 Right upper quadrant pain: Secondary | ICD-10-CM

## 2011-08-07 ENCOUNTER — Telehealth: Payer: Self-pay | Admitting: Family Medicine

## 2011-08-07 ENCOUNTER — Encounter: Payer: Self-pay | Admitting: Family Medicine

## 2011-08-07 DIAGNOSIS — K760 Fatty (change of) liver, not elsewhere classified: Secondary | ICD-10-CM | POA: Insufficient documentation

## 2011-08-07 NOTE — Telephone Encounter (Signed)
Mr Rybacki called for results of U/S. Call back # [phone]

## 2011-08-07 NOTE — Telephone Encounter (Signed)
Please call him, info attached to U/s results.  Thanks.

## 2011-08-07 NOTE — Telephone Encounter (Signed)
Patient advised.

## 2011-08-14 ENCOUNTER — Telehealth: Payer: Self-pay | Admitting: Family Medicine

## 2011-08-14 NOTE — Telephone Encounter (Signed)
Sure [employer] would like to know if you received a Diabetic Testing Supply form for this patient.  The patient's ref # is [id number].

## 2011-08-14 NOTE — Telephone Encounter (Signed)
Lugene they sent the form again.  I will give it to Dr. Para March. Thanx

## 2011-08-14 NOTE — Telephone Encounter (Signed)
I do not have this form, please advise if you do, otherwise, I will phone them to ask them to send it to [place].

## 2011-08-15 NOTE — Telephone Encounter (Signed)
Form signed, in my outbox.

## 2011-08-29 ENCOUNTER — Telehealth: Payer: Self-pay | Admitting: Family Medicine

## 2011-08-29 NOTE — Telephone Encounter (Signed)
Pt called need new Rx for his Lantus, says he was told to check sugar 2 time daily. And they only want to give him a 30 day supply. [facility] ??

## 2011-08-29 NOTE — Telephone Encounter (Signed)
Please talk to me about this.  This patient isn't on lantus.  This may be the wrong chart and will need to be corrected.

## 2011-08-30 ENCOUNTER — Other Ambulatory Visit: Payer: Self-pay | Admitting: *Deleted

## 2011-08-30 MED ORDER — ONETOUCH ULTRASOFT LANCETS MISC: Status: DC

## 2011-08-30 MED ORDER — GLUCOSE BLOOD VI STRP: ORAL_STRIP | Status: DC

## 2011-08-31 ENCOUNTER — Telehealth: Payer: Self-pay | Admitting: Family Medicine

## 2011-08-31 NOTE — Telephone Encounter (Signed)
Left detailed message on voicemail.  

## 2011-08-31 NOTE — Telephone Encounter (Signed)
Patient called, wants to know if the fatty liver will heal it self back to normal. Call back # [phone]

## 2011-08-31 NOTE — Telephone Encounter (Signed)
With weight loss, this can improve.  Will check LFTs periodically.

## 2011-09-07 ENCOUNTER — Ambulatory Visit (INDEPENDENT_AMBULATORY_CARE_PROVIDER_SITE_OTHER): Payer: BC Managed Care – PPO | Admitting: Family Medicine

## 2011-09-07 ENCOUNTER — Encounter: Payer: Self-pay | Admitting: Family Medicine

## 2011-09-07 VITALS — BP 114/70 | HR 70 | Temp 98.3°F | Wt 204.0 lb

## 2011-09-07 DIAGNOSIS — E119 Type 2 diabetes mellitus without complications: Secondary | ICD-10-CM

## 2011-09-07 DIAGNOSIS — J019 Acute sinusitis, unspecified: Secondary | ICD-10-CM

## 2011-09-07 DIAGNOSIS — J329 Chronic sinusitis, unspecified: Secondary | ICD-10-CM

## 2011-09-07 MED ORDER — METFORMIN HCL 500 MG PO TABS: ORAL_TABLET | ORAL | Status: DC

## 2011-09-07 MED ORDER — AMOXICILLIN 875 MG PO TABS: 875.0000 mg | ORAL_TABLET | Freq: Two times a day (BID) | ORAL | Status: AC

## 2011-09-07 NOTE — Patient Instructions (Addendum)
Keep using the nasal sprays and use the amoxil if you don't get better in a few days.  Take care. A1c in 4/13.  We'll let you know about the reading after that.  Keep the appointment in 7/13.

## 2011-09-07 NOTE — Progress Notes (Signed)
Walking on treadmill, losing weight with diet.  Now with RUQ pain resolved.   duration of symptoms: a little over a week Rhinorrhea: occ, clear congestion:yes ear pain: not clogged but feels full sore throat: occ in AM Cough: some, more in AM Myalgias: no Fevers: no other concerns: taking mucinex bid.  Using flonase and nasal saline.    ROS: See HPI.  Otherwise negative.    Meds, vitals, and allergies reviewed.   GEN: nad, alert and oriented HEENT: mucous membranes moist, TM w/o erythema, nasal epithelium injected, OP with cobblestoning, mas sinus ttp x2 but this is mild.  NECK: supple w/o LA CV: rrr. PULM: ctab, no inc wob ABD: soft, +bs EXT: no edema

## 2011-09-09 DIAGNOSIS — J329 Chronic sinusitis, unspecified: Secondary | ICD-10-CM | POA: Insufficient documentation

## 2011-09-09 NOTE — Assessment & Plan Note (Signed)
He's actually getting some better now.  Hold abx for now.  Supportive tx for now, use abx if not improved. He agrees.

## 2011-11-05 ENCOUNTER — Other Ambulatory Visit (INDEPENDENT_AMBULATORY_CARE_PROVIDER_SITE_OTHER): Payer: BC Managed Care – PPO

## 2011-11-05 DIAGNOSIS — E119 Type 2 diabetes mellitus without complications: Secondary | ICD-10-CM

## 2011-11-05 LAB — HEMOGLOBIN A1C: Hgb A1c MFr Bld: 6.6 % — ABNORMAL HIGH (ref 4.6–6.5)

## 2011-11-06 ENCOUNTER — Encounter: Payer: Self-pay | Admitting: *Deleted

## 2011-11-23 ENCOUNTER — Telehealth: Payer: Self-pay | Admitting: *Deleted

## 2011-11-23 NOTE — Telephone Encounter (Signed)
I'll address the hard copy.  

## 2011-11-23 NOTE — Telephone Encounter (Signed)
Form is in your in box.  Patient has been contacted and does wish to use this company for his diabetic supplies.

## 2012-01-16 ENCOUNTER — Other Ambulatory Visit (INDEPENDENT_AMBULATORY_CARE_PROVIDER_SITE_OTHER): Payer: BC Managed Care – PPO

## 2012-01-16 DIAGNOSIS — E119 Type 2 diabetes mellitus without complications: Secondary | ICD-10-CM

## 2012-01-18 ENCOUNTER — Other Ambulatory Visit: Payer: BC Managed Care – PPO

## 2012-01-25 ENCOUNTER — Ambulatory Visit (INDEPENDENT_AMBULATORY_CARE_PROVIDER_SITE_OTHER): Payer: BC Managed Care – PPO | Admitting: Family Medicine

## 2012-01-25 ENCOUNTER — Encounter: Payer: Self-pay | Admitting: Family Medicine

## 2012-01-25 VITALS — BP 110/80 | HR 80 | Temp 98.1°F | Ht 66.25 in | Wt 199.0 lb

## 2012-01-25 DIAGNOSIS — E119 Type 2 diabetes mellitus without complications: Secondary | ICD-10-CM

## 2012-01-25 NOTE — Assessment & Plan Note (Signed)
No change in meds.  Doing well.  Recheck later in 2013.  Continue diet.

## 2012-01-25 NOTE — Patient Instructions (Addendum)
I would get a flu shot each fall.   Recheck labs before a visit in 3 months.  Take care.   Glad to see you.

## 2012-01-25 NOTE — Progress Notes (Signed)
Diabetes:  Using medications without difficulties:yes Hypoglycemic episodes:very rare, only with prolonged fasting Hyperglycemic episodes:no Feet problems: no Blood Sugars averaging:  120-140 eye exam within last year: yes A1c 6.6 No more RUQ pain.  He getting more veggies from the garden.    Meds, vitals, and allergies reviewed.   ROS: See HPI.  Otherwise negative.    GEN: nad, alert and oriented HEENT: mucous membranes moist NECK: supple w/o LA CV: rrr. PULM: ctab, no inc wob ABD: soft, +bs EXT: no edema SKIN: no acute rash  Diabetic foot exam: Normal inspection No skin breakdown No calluses  Normal DP pulses Normal sensation to light touch and monofilament Nails normal

## 2012-02-25 ENCOUNTER — Telehealth: Payer: Self-pay | Admitting: Family Medicine

## 2012-02-25 ENCOUNTER — Telehealth: Payer: Self-pay | Admitting: *Deleted

## 2012-02-25 NOTE — Telephone Encounter (Signed)
Patient would like for you to call him to go over his meds.

## 2012-02-25 NOTE — Telephone Encounter (Signed)
Patient says the Metformin is giving him diarrhea.  He is taking 4 pills a day and that is about how often he has to have a BM and it's very loose.  He says if he misses a dose, things are better for him.  Please advise.

## 2012-02-25 NOTE — Telephone Encounter (Signed)
Cut down to 3 pills a day.  If still with loose stools, then dec to 2 tabs a day, then 1 a day if needed.  Goal to take as many as possible per day (ie up to 4) w/o GI side effects.

## 2012-02-25 NOTE — Telephone Encounter (Signed)
I tried to call him but couldn't get an answer.  Please try to get him tomorrow.

## 2012-02-26 NOTE — Telephone Encounter (Signed)
Patient advised.

## 2012-02-26 NOTE — Telephone Encounter (Signed)
LMOVM to return call.

## 2012-03-03 ENCOUNTER — Other Ambulatory Visit: Payer: Self-pay | Admitting: *Deleted

## 2012-03-03 MED ORDER — SIMVASTATIN 40 MG PO TABS: 40.0000 mg | ORAL_TABLET | Freq: Every day | ORAL | Status: DC

## 2012-03-31 ENCOUNTER — Other Ambulatory Visit: Payer: Self-pay | Admitting: *Deleted

## 2012-03-31 MED ORDER — LISINOPRIL 10 MG PO TABS: 10.0000 mg | ORAL_TABLET | Freq: Every day | ORAL | Status: DC

## 2012-04-22 ENCOUNTER — Other Ambulatory Visit (INDEPENDENT_AMBULATORY_CARE_PROVIDER_SITE_OTHER): Payer: BC Managed Care – PPO

## 2012-04-22 DIAGNOSIS — E119 Type 2 diabetes mellitus without complications: Secondary | ICD-10-CM

## 2012-07-16 DIAGNOSIS — C439 Malignant melanoma of skin, unspecified: Secondary | ICD-10-CM

## 2012-07-16 HISTORY — PX: VASECTOMY: SHX75

## 2012-07-16 HISTORY — DX: Malignant melanoma of skin, unspecified: C43.9

## 2012-07-25 ENCOUNTER — Ambulatory Visit (INDEPENDENT_AMBULATORY_CARE_PROVIDER_SITE_OTHER): Payer: BC Managed Care – PPO | Admitting: Family Medicine

## 2012-07-25 ENCOUNTER — Encounter: Payer: Self-pay | Admitting: *Deleted

## 2012-07-25 ENCOUNTER — Encounter: Payer: Self-pay | Admitting: Family Medicine

## 2012-07-25 VITALS — BP 116/88 | HR 73 | Temp 98.3°F | Wt 207.0 lb

## 2012-07-25 DIAGNOSIS — E782 Mixed hyperlipidemia: Secondary | ICD-10-CM

## 2012-07-25 DIAGNOSIS — Z23 Encounter for immunization: Secondary | ICD-10-CM

## 2012-07-25 DIAGNOSIS — I10 Essential (primary) hypertension: Secondary | ICD-10-CM

## 2012-07-25 DIAGNOSIS — E119 Type 2 diabetes mellitus without complications: Secondary | ICD-10-CM

## 2012-07-25 LAB — LIPID PANEL
Cholesterol: 149 mg/dL (ref 0–200)
Total CHOL/HDL Ratio: 4

## 2012-07-25 LAB — COMPREHENSIVE METABOLIC PANEL
ALT: 41 U/L (ref 0–53)
Albumin: 4.1 g/dL (ref 3.5–5.2)
CO2: 25 mEq/L (ref 19–32)
Calcium: 9 mg/dL (ref 8.4–10.5)
Chloride: 105 mEq/L (ref 96–112)
Creatinine, Ser: 0.9 mg/dL (ref 0.4–1.5)
GFR: 96.14 mL/min (ref >60.00)
Potassium: 4.6 mEq/L (ref 3.5–5.1)
Sodium: 138 mEq/L (ref 135–145)
Total Protein: 6.9 g/dL (ref 6.0–8.3)

## 2012-07-25 NOTE — Progress Notes (Signed)
Diabetes:  Using medications without difficulties:yes, though he has occ diarrhea- improved if he skips a dose of metformin.   Hypoglycemic episodes:no Hyperglycemic episodes:no Feet problems:no Blood Sugars averaging: ~120-140 in AM eye exam within last year: yes  Hypertension:   Using medication without problems or lightheadedness: yes Chest pain with exertion:no Edema:no Short of breath:no  Elevated Cholesterol: Using medications without problems: yes Muscle aches: no Diet compliance: yes Exercise: yes  PMH and SH reviewed.   Vital signs, Meds and allergies reviewed.  ROS: See HPI.  Otherwise nontributory.   GEN: nad, alert and oriented HEENT: mucous membranes moist NECK: supple w/o LA CV: rrr.  no murmur PULM: ctab, no inc wob ABD: soft, +bs EXT: no edema SKIN: no acute rash  Diabetic foot exam: Normal inspection No skin breakdown but dry skin noted No calluses  Normal DP pulses Normal sensation to light tough and monofilament Nails normal

## 2012-07-25 NOTE — Patient Instructions (Addendum)
Go to the lab on the way out.  We'll contact you with your lab report. Don't change your meds for now.   Glad to see you.   

## 2012-07-27 NOTE — Assessment & Plan Note (Signed)
Controlled, continue work on weight.  No change in meds.   

## 2012-07-27 NOTE — Assessment & Plan Note (Signed)
Inc in TG noted, continue work on [employer].  No change in meds.  He'll f/u in 6 months for OV.

## 2012-07-27 NOTE — Assessment & Plan Note (Signed)
Controlled, continue work on weight.  No change in meds.

## 2012-09-01 ENCOUNTER — Other Ambulatory Visit: Payer: Self-pay

## 2012-09-01 MED ORDER — GLUCOSE BLOOD VI STRP: ORAL_STRIP | Status: DC

## 2012-09-01 NOTE — Telephone Encounter (Signed)
Pt request refill glucose test strips to [facility] [address].pt notified done.

## 2012-09-02 ENCOUNTER — Other Ambulatory Visit: Payer: Self-pay | Admitting: *Deleted

## 2012-09-02 MED ORDER — FLUTICASONE PROPIONATE 50 MCG/ACT NA SUSP: 1.0000 | Freq: Every day | NASAL | Status: DC

## 2012-09-18 ENCOUNTER — Other Ambulatory Visit: Payer: Self-pay | Admitting: *Deleted

## 2012-09-18 MED ORDER — METFORMIN HCL 500 MG PO TABS: ORAL_TABLET | ORAL | Status: DC

## 2012-11-21 ENCOUNTER — Encounter: Payer: Self-pay | Admitting: Family Medicine

## 2012-11-21 ENCOUNTER — Ambulatory Visit (INDEPENDENT_AMBULATORY_CARE_PROVIDER_SITE_OTHER): Payer: BC Managed Care – PPO | Admitting: Family Medicine

## 2012-11-21 VITALS — BP 116/82 | HR 74 | Temp 97.9°F | Wt 209.0 lb

## 2012-11-21 DIAGNOSIS — J329 Chronic sinusitis, unspecified: Secondary | ICD-10-CM

## 2012-11-21 DIAGNOSIS — Z3009 Encounter for other general counseling and advice on contraception: Secondary | ICD-10-CM

## 2012-11-21 NOTE — Patient Instructions (Addendum)
See Shirlee Limerick about your referral before you leave today (ENT and URO). Increase the flonase for about 10 days- 2 sprays in each nostril twice a day. Then try to go back to the previous dose.   Take care.

## 2012-11-21 NOTE — Progress Notes (Signed)
Wanted to talk about possible surgery.  He's been having sig trouble with allergies, still using flonase and nasal saline. He is still having sig ear pressure. No fevers.  His ears feel stopped up and has postnasal gtt.  He didn't know what should be done.  Already on alavert.  This is always difficult for him the spring and fall.  He wasn't allergy tested yet.    He was thinking about having a vasectomy.  Desires not to have more kids.  We talked about referral.    Meds, vitals, and allergies reviewed.   ROS: See HPI.  Otherwise, noncontributory.  GEN: nad, alert and oriented HEENT: mucous membranes moist, TM wnl, nasal exam stuffy, OP wnl, max sinuses mildly ttp B NECK: supple w/o LA CV: rrr.  PULM: ctab, no inc wob ABD: soft, +bs

## 2012-11-21 NOTE — Assessment & Plan Note (Signed)
Refer.  App uro help. He agrees.

## 2012-11-21 NOTE — Assessment & Plan Note (Signed)
This is a chronic issue for him.  Would inc the flonase for now and then have him see ENT.  Nasal passage is tighter on the R side.  He may benefit from allergy testing.

## 2013-01-22 ENCOUNTER — Other Ambulatory Visit: Payer: Self-pay

## 2013-01-23 ENCOUNTER — Ambulatory Visit: Payer: BC Managed Care – PPO | Admitting: Family Medicine

## 2013-01-26 ENCOUNTER — Ambulatory Visit: Payer: BC Managed Care – PPO | Admitting: Family Medicine

## 2013-01-30 ENCOUNTER — Ambulatory Visit (INDEPENDENT_AMBULATORY_CARE_PROVIDER_SITE_OTHER): Payer: BC Managed Care – PPO | Admitting: Family Medicine

## 2013-01-30 ENCOUNTER — Encounter: Payer: Self-pay | Admitting: Family Medicine

## 2013-01-30 VITALS — BP 108/70 | HR 73 | Temp 97.9°F | Wt 207.5 lb

## 2013-01-30 DIAGNOSIS — E119 Type 2 diabetes mellitus without complications: Secondary | ICD-10-CM

## 2013-01-30 LAB — LDL CHOLESTEROL, DIRECT: Direct LDL: 79.6 mg/dL

## 2013-01-30 LAB — HEMOGLOBIN A1C: Hgb A1c MFr Bld: 7.1 % — ABNORMAL HIGH (ref 4.6–6.5)

## 2013-01-30 LAB — LIPID PANEL: Total CHOL/HDL Ratio: 4

## 2013-01-30 NOTE — Progress Notes (Signed)
Diabetes:  Using medications without difficulties:yes Hypoglycemic episodes:no Hyperglycemic episodes:no Feet problems:no Blood Sugars averaging: ~130-140 Diet: "so/so, trying to watch what I eat"  He had a vasectomy last week. The bruising is resolving.  He has f/u with uro for next week.    He had f/u with derm about a precancerous lesion on his face (removed per derm).  Derm is following a macule on his L foot.    PMH and SH reviewed  Meds, vitals, and allergies reviewed.   ROS: See HPI.  Otherwise negative.    GEN: nad, alert and oriented HEENT: mucous membranes moist NECK: supple w/o LA CV: rrr. PULM: ctab, no inc wob ABD: soft, +bs EXT: no edema SKIN: no acute rash  Diabetic foot exam: Normal inspection No skin breakdown No calluses  Normal DP pulses Normal sensation to light touch and monofilament Nails normal

## 2013-01-30 NOTE — Assessment & Plan Note (Addendum)
See notes on labs.  He's working on diet and weight.  Recheck in about 6 months at CPE.  No change in meds now.

## 2013-01-30 NOTE — Patient Instructions (Addendum)
Go to the lab on the way out.  We'll contact you with your lab report. I would get a flu shot each fall.    Recheck in about 6 months at a physical with labs ahead of time.  Take care.  Glad to see you.

## 2013-02-02 ENCOUNTER — Encounter: Payer: Self-pay | Admitting: *Deleted

## 2013-03-30 ENCOUNTER — Telehealth: Payer: Self-pay | Admitting: *Deleted

## 2013-03-30 NOTE — Telephone Encounter (Signed)
Thanks

## 2013-03-30 NOTE — Telephone Encounter (Signed)
Received a faxed form from company requesting that it be complete for a compound cream for the patient to use on the affected area. Called and spoke to the patient and was advised that he has not requested this and told the company to quit calling him or he was going to report them to the BBB.   Sent form back to company showing denied and to not contact the office for this again per the patient.

## 2013-05-04 ENCOUNTER — Other Ambulatory Visit: Payer: Self-pay | Admitting: Family Medicine

## 2013-07-13 ENCOUNTER — Other Ambulatory Visit: Payer: Self-pay | Admitting: Family Medicine

## 2013-07-24 ENCOUNTER — Other Ambulatory Visit: Payer: BC Managed Care – PPO

## 2013-07-31 ENCOUNTER — Encounter: Payer: BC Managed Care – PPO | Admitting: Family Medicine

## 2013-08-05 ENCOUNTER — Telehealth: Payer: Self-pay | Admitting: *Deleted

## 2013-08-05 DIAGNOSIS — R454 Irritability and anger: Secondary | ICD-10-CM

## 2013-08-05 NOTE — Telephone Encounter (Signed)
Spoke to patient and was advised that he is not interested in seeing Dr. Rexene Edison. He stated that his wife sees her for anxiety and it has not done any good.

## 2013-08-05 NOTE — Telephone Encounter (Signed)
I would try to get set up with Dr. Rexene Edison.  I put in the referral.  Thanks.

## 2013-08-05 NOTE — Telephone Encounter (Signed)
I checked on this and we're in the midst of getting him another referral option.  Rosaria Ferries should be in contact with him soon.  Thanks.

## 2013-08-05 NOTE — Telephone Encounter (Signed)
I spoke with patient and he was inquiring about anger management. He said he is very short tempered and would like to be referred to someone to help with this. He said he isn't afraid of hurting anyone or himself in anyway at all, but he just needs some help with managing his anger. I told him Lugene or Rosaria Ferries would be contacting him about a referral. He had an appointment with you this month, but had to reschedule for March, so that's why he was asking about it now.

## 2013-08-06 NOTE — Telephone Encounter (Signed)
Patient advised.

## 2013-09-11 ENCOUNTER — Other Ambulatory Visit: Payer: Self-pay | Admitting: Family Medicine

## 2013-09-11 DIAGNOSIS — E119 Type 2 diabetes mellitus without complications: Secondary | ICD-10-CM

## 2013-09-18 ENCOUNTER — Other Ambulatory Visit (INDEPENDENT_AMBULATORY_CARE_PROVIDER_SITE_OTHER): Payer: BC Managed Care – PPO

## 2013-09-18 DIAGNOSIS — E119 Type 2 diabetes mellitus without complications: Secondary | ICD-10-CM

## 2013-09-18 LAB — COMPREHENSIVE METABOLIC PANEL
ALT: 35 U/L (ref 0–53)
AST: 21 U/L (ref 0–37)
Albumin: 4.2 g/dL (ref 3.5–5.2)
Alkaline Phosphatase: 34 U/L — ABNORMAL LOW (ref 39–117)
BUN: 10 mg/dL (ref 6–23)
CALCIUM: 9.4 mg/dL (ref 8.4–10.5)
CHLORIDE: 104 meq/L (ref 96–112)
CO2: 27 meq/L (ref 19–32)
CREATININE: 1.1 mg/dL (ref 0.4–1.5)
GFR: 83.08 mL/min (ref >60.00)
GLUCOSE: 154 mg/dL — AB (ref 70–99)
Potassium: 4.7 mEq/L (ref 3.5–5.1)
Sodium: 139 mEq/L (ref 135–145)
Total Bilirubin: 0.8 mg/dL (ref 0.3–1.2)
Total Protein: 7.1 g/dL (ref 6.0–8.3)

## 2013-09-18 LAB — HEMOGLOBIN A1C: HEMOGLOBIN A1C: 7.5 % — AB (ref 4.6–6.5)

## 2013-09-18 LAB — LIPID PANEL
CHOLESTEROL: 183 mg/dL (ref 0–200)
HDL: 44.9 mg/dL (ref >39.00)
LDL Cholesterol: 77 mg/dL (ref 0–99)
TRIGLYCERIDES: 304 mg/dL — AB (ref 0.0–149.0)
Total CHOL/HDL Ratio: 4
VLDL: 60.8 mg/dL — ABNORMAL HIGH (ref 0.0–40.0)

## 2013-09-20 ENCOUNTER — Encounter: Payer: Self-pay | Admitting: Family Medicine

## 2013-09-20 DIAGNOSIS — C439 Malignant melanoma of skin, unspecified: Secondary | ICD-10-CM | POA: Insufficient documentation

## 2013-09-20 DIAGNOSIS — Z85820 Personal history of malignant melanoma of skin: Secondary | ICD-10-CM | POA: Insufficient documentation

## 2013-09-25 ENCOUNTER — Ambulatory Visit (INDEPENDENT_AMBULATORY_CARE_PROVIDER_SITE_OTHER): Payer: BC Managed Care – PPO | Admitting: Family Medicine

## 2013-09-25 ENCOUNTER — Encounter: Payer: Self-pay | Admitting: Family Medicine

## 2013-09-25 VITALS — BP 128/90 | HR 80 | Temp 98.2°F | Ht 66.25 in | Wt 202.8 lb

## 2013-09-25 DIAGNOSIS — I10 Essential (primary) hypertension: Secondary | ICD-10-CM

## 2013-09-25 DIAGNOSIS — E119 Type 2 diabetes mellitus without complications: Secondary | ICD-10-CM

## 2013-09-25 DIAGNOSIS — Z0000 Encounter for general adult medical examination without abnormal findings: Secondary | ICD-10-CM

## 2013-09-25 DIAGNOSIS — Z23 Encounter for immunization: Secondary | ICD-10-CM

## 2013-09-25 DIAGNOSIS — E782 Mixed hyperlipidemia: Secondary | ICD-10-CM

## 2013-09-25 DIAGNOSIS — C439 Malignant melanoma of skin, unspecified: Secondary | ICD-10-CM

## 2013-09-25 MED ORDER — SIMVASTATIN 40 MG PO TABS: ORAL_TABLET | ORAL | Status: DC

## 2013-09-25 MED ORDER — LISINOPRIL 10 MG PO TABS: ORAL_TABLET | ORAL | Status: DC

## 2013-09-25 MED ORDER — FLUTICASONE PROPIONATE 50 MCG/ACT NA SUSP: 1.0000 | Freq: Every day | NASAL | Status: DC

## 2013-09-25 MED ORDER — METFORMIN HCL 500 MG PO TABS: ORAL_TABLET | ORAL | Status: DC

## 2013-09-25 NOTE — Patient Instructions (Signed)
Recheck in 6 months.  Call me if I can be of service in the meantime. Labs ahead of time. Take care.  Glad to see you.

## 2013-09-25 NOTE — Progress Notes (Signed)
Pre visit review using our clinic review tool, if applicable. No additional management support is needed unless otherwise documented below in the visit note.  CPE- See plan.  Routine anticipatory guidance given to patient.  See health maintenance. Tetanus 2010 PNA shot today.  Flu shot prev done 2014 at work.  Colon and prostate screening not due.  Living will d/w pt.  Would have mother designated if incapacitated.  He is separated from his wife currently.  He moved back in with his parents.  Split custody for now.  Divorce is likely.  He hasn't called a lawyer yet.    Melanoma removed by derm, complicated by skin infection. Has seen derm mult times in the meantime.  Has been back on treadmill with good tolerance in the meantime.  He'll have routine f/u with derm now.  I appreciate help of all involved.   Diabetes:  Using medications without difficulties:yes Hypoglycemic episodes:no Hyperglycemic episodes:no Feet problems:see above, none now Blood Sugars averaging: ~140 eye exam within last year: yes  Hypertension:    Using medication without problems or lightheadedness: yes Chest pain with exertion:no Edema:no Short of breath:no  Elevated Cholesterol: Using medications without problems:yes Muscle aches: no Diet compliance:yes Exercise:yes, walking for exercise  PMH and SH reviewed.   Vital signs, Meds and allergies reviewed.  ROS: See HPI.  Otherwise nontributory.   GEN: nad, alert and oriented HEENT: mucous membranes moist NECK: supple w/o LA CV: rrr.  no murmur PULM: ctab, no inc wob ABD: soft, +bs EXT: no edema SKIN: no acute rash  Diabetic foot exam: Normal inspection, scar healing on L foot No skin breakdown No calluses  Normal DP pulses Normal sensation to light tough and monofilament Nails normal

## 2013-09-27 DIAGNOSIS — Z0000 Encounter for general adult medical examination without abnormal findings: Secondary | ICD-10-CM | POA: Insufficient documentation

## 2013-09-27 NOTE — Assessment & Plan Note (Signed)
Reasonable control except for TGs, he'll work on diet and exercise, no change in meds.  Sig social upheaval noted.

## 2013-09-27 NOTE — Assessment & Plan Note (Signed)
Reasonable control, he'll work on diet and exercise, no change in meds.  Sig social upheaval noted.

## 2013-09-27 NOTE — Assessment & Plan Note (Signed)
Per derm 

## 2013-09-27 NOTE — Assessment & Plan Note (Signed)
Routine anticipatory guidance given to patient.  See health maintenance. Tetanus 2010 PNA shot today.  Flu shot prev done 2014 at work.  Colon and prostate screening not due.  Living will d/w pt.  Would have mother designated if incapacitated.  He is separated from his wife currently.  He moved back in with his parents.  Split custody for now.  Divorce is likely.  He hasn't called a lawyer yet.

## 2013-09-27 NOTE — Assessment & Plan Note (Signed)
Reasonable control given the recent upheaval, he'll work on diet and exercise, no change in meds.  Recheck in about 6 months, sooner if sugars are drifting up on home checks.

## 2013-09-28 ENCOUNTER — Telehealth: Payer: Self-pay | Admitting: Family Medicine

## 2013-09-28 NOTE — Telephone Encounter (Signed)
Relevant patient education assigned to patient using Emmi. ° °

## 2014-01-22 LAB — HM DIABETES EYE EXAM

## 2014-02-02 ENCOUNTER — Encounter: Payer: Self-pay | Admitting: Family Medicine

## 2014-02-22 ENCOUNTER — Encounter: Payer: Self-pay | Admitting: Family Medicine

## 2014-02-22 ENCOUNTER — Ambulatory Visit (INDEPENDENT_AMBULATORY_CARE_PROVIDER_SITE_OTHER): Payer: BC Managed Care – PPO | Admitting: Family Medicine

## 2014-02-22 VITALS — BP 126/100 | HR 76 | Temp 97.9°F | Wt 207.5 lb

## 2014-02-22 DIAGNOSIS — R21 Rash and other nonspecific skin eruption: Secondary | ICD-10-CM

## 2014-02-22 MED ORDER — FLUTICASONE PROPIONATE 50 MCG/ACT NA SUSP: 1.0000 | Freq: Every day | NASAL | Status: DC

## 2014-02-22 MED ORDER — LISINOPRIL 10 MG PO TABS: ORAL_TABLET | ORAL | Status: DC

## 2014-02-22 MED ORDER — METFORMIN HCL 500 MG PO TABS: ORAL_TABLET | ORAL | Status: DC

## 2014-02-22 MED ORDER — DOXYCYCLINE HYCLATE 100 MG PO TABS: 100.0000 mg | ORAL_TABLET | Freq: Two times a day (BID) | ORAL | Status: DC

## 2014-02-22 MED ORDER — SIMVASTATIN 40 MG PO TABS: ORAL_TABLET | ORAL | Status: DC

## 2014-02-22 NOTE — Patient Instructions (Addendum)
Get over the counter ketoconazole for the groin rash.   Start the doxy for the tick bite.  Be careful with sun exposure. Take care.  Glad to see you.

## 2014-02-22 NOTE — Progress Notes (Signed)
Pre visit review using our clinic review tool, if applicable. No additional management support is needed unless otherwise documented below in the visit note.  Stung by mult yellow jackets about 1 week ago. Had been working outside.  Friday found an engorged tick on his L foot.  Pulled off, area cleaned.  No fevers.  Now with small red dots on B feet, not at sting sites.  They don't itch. No bullseye rash.    Noted a B groin rash, had been using vinegar on the area, is itching less now.    Meds, vitals, and allergies reviewed.   ROS: See HPI.  Otherwise, noncontributory.  nad ncat Mmm rrr ctab abd soft B inguinal rash, typical for fungal infection.  Blanching small red macules on the BLE w/o edema. No bullseye rash.

## 2014-02-23 DIAGNOSIS — R21 Rash and other nonspecific skin eruption: Secondary | ICD-10-CM | POA: Insufficient documentation

## 2014-02-23 NOTE — Assessment & Plan Note (Signed)
otc topical ketoconazole for the B inguinal fungal rash.  Given the tick bite, engorged, would start doxy with routine cautions.  He agrees.

## 2014-02-26 ENCOUNTER — Telehealth: Payer: Self-pay

## 2014-02-26 MED ORDER — TRAMADOL HCL 50 MG PO TABS: 50.0000 mg | ORAL_TABLET | Freq: Three times a day (TID) | ORAL | Status: DC | PRN

## 2014-02-26 NOTE — Telephone Encounter (Signed)
Medication phoned to pharmacy. Patient advised.  

## 2014-02-26 NOTE — Telephone Encounter (Signed)
Pt left v/m; pt seen on 02/22/14 and having back pain, ibuprofen, tylenol and heat not helping back pain. Pt request pain med to [facility] [address]. Pt request cb.

## 2014-02-26 NOTE — Telephone Encounter (Signed)
Please call in tramadol. Sedation caution.  F/u if not improved.  Thanks.

## 2014-03-23 ENCOUNTER — Other Ambulatory Visit: Payer: Self-pay | Admitting: Family Medicine

## 2014-03-23 DIAGNOSIS — E119 Type 2 diabetes mellitus without complications: Secondary | ICD-10-CM

## 2014-03-26 ENCOUNTER — Other Ambulatory Visit (INDEPENDENT_AMBULATORY_CARE_PROVIDER_SITE_OTHER): Payer: BC Managed Care – PPO

## 2014-03-26 ENCOUNTER — Encounter: Payer: Self-pay | Admitting: Radiology

## 2014-03-26 DIAGNOSIS — E119 Type 2 diabetes mellitus without complications: Secondary | ICD-10-CM

## 2014-03-26 LAB — HEMOGLOBIN A1C: HEMOGLOBIN A1C: 8.5 % — AB (ref 4.6–6.5)

## 2014-04-02 ENCOUNTER — Encounter: Payer: Self-pay | Admitting: Family Medicine

## 2014-04-02 ENCOUNTER — Ambulatory Visit (INDEPENDENT_AMBULATORY_CARE_PROVIDER_SITE_OTHER): Payer: BC Managed Care – PPO | Admitting: Family Medicine

## 2014-04-02 VITALS — BP 142/90 | HR 69 | Temp 97.8°F | Wt 205.0 lb

## 2014-04-02 DIAGNOSIS — E119 Type 2 diabetes mellitus without complications: Secondary | ICD-10-CM

## 2014-04-02 NOTE — Patient Instructions (Signed)
Recheck A1c in 3 months at a lab visit.   We'll see if you need a visit at that point.   Plan on a physical in about 6 months.   Keep working on your diet and keep exercising.  Take care.  Flu shot at work.

## 2014-04-02 NOTE — Progress Notes (Signed)
Pre visit review using our clinic review tool, if applicable. No additional management support is needed unless otherwise documented below in the visit note.  Diabetes:  Using medications without difficulties: yes Hypoglycemic episodes: no Hyperglycemic episodes: no Feet problems: no Blood Sugars averaging: ~130s eye exam within last year: yes A1c up.  D/w pt.   Flu shot to be done at work.  He's back on the treadmill now, working on that.  This is a recent improvement.    1 week ago hit in face with a softball, playing second base.  No LOC.  Bruised near L eye.  No vision loss. No dec in eye ROM.  No eye pain.  No double vision.  Swelling better now, after icing.  Bruising migrating down the face, as expected.    Living situation discussed, divorce pending.  Back living with his family.  He's working through the situation.    Meds, vitals, and allergies reviewed.   ROS: See HPI.  Otherwise negative.    GEN: nad, alert and oriented HEENT: mucous membranes moist, PERRL, EOMI, bruising note around L eye.  Not ttp on the orbit.   NECK: supple w/o LA CV: rrr. PULM: ctab, no inc wob ABD: soft, +bs EXT: no edema SKIN: no acute rash  Diabetic foot exam: Normal inspection No skin breakdown No calluses  Normal DP pulses Normal sensation to light touch and monofilament Nails normal

## 2014-04-04 NOTE — Assessment & Plan Note (Signed)
He'll work on diet and exercise.  A1c d/w pt.  Recheck A1c in 3 months, we'll decide about OV at that point.  No change in meds.  He agrees.   Normal foot exam today.  Facial bruising is incidental and should resolve.

## 2014-04-08 ENCOUNTER — Encounter: Payer: Self-pay | Admitting: Family Medicine

## 2014-05-04 ENCOUNTER — Other Ambulatory Visit: Payer: Self-pay

## 2014-05-04 ENCOUNTER — Telehealth: Payer: Self-pay | Admitting: Family Medicine

## 2014-05-04 MED ORDER — GLUCOSE BLOOD VI STRP: ORAL_STRIP | Status: DC

## 2014-05-04 NOTE — Telephone Encounter (Signed)
Patient notified that script has been sent to the pharmacy with the changes per his request.

## 2014-05-04 NOTE — Telephone Encounter (Signed)
Okay to check 3 times a day due to high A1c and fluctuating blood glucose.  Thanks.

## 2014-05-04 NOTE — Telephone Encounter (Signed)
error 

## 2014-05-04 NOTE — Telephone Encounter (Signed)
Pt request refill of onetouch ultra test strip; pts instruction was ck BS twice a day; pt request refill test strips with new instructions to test BS three times a day. Pt states BS has recently been running higher; 05/03/14 FBS 210 and 2 hour later after taking med BS  Was 180. At 4pm after working BS 158 and 10 pm BS was 117. This morning FBS was 126; 11:15 AM today BS was 104. Have not changed instructions on med list until authorized by Dr Damita Dunnings.[place]

## 2014-05-14 ENCOUNTER — Telehealth: Payer: Self-pay

## 2014-05-14 NOTE — Telephone Encounter (Signed)
Per Dr Damita Dunnings, form has not yet been received

## 2014-05-14 NOTE — Telephone Encounter (Signed)
Gordon Hudson with [facility] request status of faxed request for diabetic supplement prescription using ref # [id number].Please advise.

## 2014-05-17 NOTE — Telephone Encounter (Signed)
Noted, thanks!

## 2014-05-17 NOTE — Telephone Encounter (Signed)
Spoke to patient and was advised that he has told this company that he does not want anything from them. Patient stated that he has told them to quit calling him. Called and spoke to [place] at [employer] and advised her of this and told her not to send anything to the office requesting anything for the patient.

## 2014-07-01 ENCOUNTER — Emergency Department (HOSPITAL_COMMUNITY)
Admission: EM | Admit: 2014-07-01 | Discharge: 2014-07-02 | Disposition: A | Discharge status: Home / Self Care | Payer: BC Managed Care – PPO | Attending: Emergency Medicine | Admitting: Emergency Medicine

## 2014-07-01 ENCOUNTER — Encounter (HOSPITAL_COMMUNITY): Payer: Self-pay | Admitting: Emergency Medicine

## 2014-07-01 DIAGNOSIS — N5089 Other specified disorders of the male genital organs: Secondary | ICD-10-CM

## 2014-07-01 DIAGNOSIS — E119 Type 2 diabetes mellitus without complications: Secondary | ICD-10-CM | POA: Insufficient documentation

## 2014-07-01 DIAGNOSIS — Z8639 Personal history of other endocrine, nutritional and metabolic disease: Secondary | ICD-10-CM | POA: Diagnosis not present

## 2014-07-01 DIAGNOSIS — Z8669 Personal history of other diseases of the nervous system and sense organs: Secondary | ICD-10-CM | POA: Diagnosis not present

## 2014-07-01 DIAGNOSIS — N50811 Right testicular pain: Secondary | ICD-10-CM

## 2014-07-01 DIAGNOSIS — N508 Other specified disorders of male genital organs: Secondary | ICD-10-CM | POA: Diagnosis not present

## 2014-07-01 DIAGNOSIS — Z79899 Other long term (current) drug therapy: Secondary | ICD-10-CM | POA: Insufficient documentation

## 2014-07-01 DIAGNOSIS — M791 Myalgia: Secondary | ICD-10-CM | POA: Diagnosis not present

## 2014-07-01 DIAGNOSIS — Z7951 Long term (current) use of inhaled steroids: Secondary | ICD-10-CM | POA: Insufficient documentation

## 2014-07-01 DIAGNOSIS — I10 Essential (primary) hypertension: Secondary | ICD-10-CM | POA: Insufficient documentation

## 2014-07-01 DIAGNOSIS — R109 Unspecified abdominal pain: Secondary | ICD-10-CM | POA: Diagnosis present

## 2014-07-01 DIAGNOSIS — Z85820 Personal history of malignant melanoma of skin: Secondary | ICD-10-CM | POA: Diagnosis not present

## 2014-07-01 LAB — CBC WITH DIFFERENTIAL/PLATELET
BASOS ABS: 0 10*3/uL (ref 0.0–0.1)
BASOS PCT: 1 % (ref 0–1)
EOS ABS: 0.2 10*3/uL (ref 0.0–0.7)
Eosinophils Relative: 2 % (ref 0–5)
HCT: 43.7 % (ref 39.0–52.0)
Hemoglobin: 15.2 g/dL (ref 13.0–17.0)
Lymphocytes Relative: 44 % (ref 12–46)
Lymphs Abs: 3.2 10*3/uL (ref 0.7–4.0)
MCH: 30.5 pg (ref 26.0–34.0)
MCHC: 34.8 g/dL (ref 30.0–36.0)
MCV: 87.6 fL (ref 78.0–100.0)
MONOS PCT: 8 % (ref 3–12)
Monocytes Absolute: 0.6 10*3/uL (ref 0.1–1.0)
NEUTROS ABS: 3.4 10*3/uL (ref 1.7–7.7)
NEUTROS PCT: 45 % (ref 43–77)
PLATELETS: 213 10*3/uL (ref 150–400)
RBC: 4.99 MIL/uL (ref 4.22–5.81)
RDW: 12.3 % (ref 11.5–15.5)
WBC: 7.4 10*3/uL (ref 4.0–10.5)

## 2014-07-01 NOTE — ED Provider Notes (Signed)
CSN: [id number]     Arrival date & time 07/01/14  2315 History  This chart was scribed for Gordon Rice, MD by Chester Holstein, ED Scribe. This patient was seen in room A01C/A01C and the patient's care was started at 3:43 AM.    Chief Complaint  Patient presents with  . Groin Swelling  . Abdominal Pain    Patient is a 38 y.o. male presenting with abdominal pain. The history is provided by the patient. No language interpreter was used.  Abdominal Pain Associated symptoms: nausea (yesterday) and vomiting (yesterday)   Associated symptoms: no diarrhea, no dysuria and no fever     HPI Comments: Gordon Hudson is a 38 y.o. male who presents to the Emergency Department complaining of intermittent testicular pain with onset yesterday.  Pt notes he became nauseated yesterday afternoon, vomiting once, and then he noticed scrotal swelling and right sided abdominal pain. Pt notes 5/10 constant pain today. Pt notes he has been having muscles spasms near his rib cage and acid reflux today. Pt states ambulation worsens the pain. He denies nausea or vomiting today. Pt has PSH of vasectomy in 2014.   Pt denies back pain, fever, chills, changes in urination, and diarrhea.  Patient denies penile discharge. He is sexually active and does not use protection.  Past Medical History  Diagnosis Date  . Allergy     Spring and Fall  . Diabetes mellitus 07/16/2004    Type II  . Hyperlipidemia   . Hypertension   . Seizure disorder     Age 37 but no subsequent seizures, no meds since before 1st grade  . Melanoma 2014   Past Surgical History  Procedure Laterality Date  . Tympanostomy tube placement  Age 37  . Repair of ruptured achilles tendon  04/1999    Dr. Holland [place]  . Vasectomy  2014   Family History  Problem Relation Age of Onset  . Diabetes Mother   . Hyperlipidemia Mother   . Hyperlipidemia Father   . Glaucoma Father    History  Substance Use Topics  . Smoking status: Never Smoker   . Smokeless  tobacco: Never Used  . Alcohol Use: Yes     Comment: occasionally    Review of Systems  Constitutional: Negative for fever.  Gastrointestinal: Positive for nausea (yesterday), vomiting (yesterday) and abdominal pain. Negative for diarrhea.  Genitourinary: Positive for scrotal swelling and testicular pain. Negative for dysuria, flank pain, discharge, penile swelling, difficulty urinating and penile pain.  Musculoskeletal: Positive for myalgias. Negative for back pain.  All other systems reviewed and are negative.     Allergies  Rubbing alcohol  Home Medications   Prior to Admission medications   Medication Sig Start Date End Date Taking? Authorizing Provider  Garlic 568 MG TABS Take 1 tablet by mouth daily.     Yes Historical Provider, MD  lisinopril (PRINIVIL,ZESTRIL) 10 MG tablet TAKE ONE TABLET BY MOUTH EVERY DAY Patient taking differently: Take 10 mg by mouth daily.  02/22/14  Yes [name], MD  metFORMIN (GLUCOPHAGE) 500 MG tablet TAKE TWO TABLETS BY MOUTH IN THE MORNING AND TAKE TWO TABLETS BY MOUTH IN THE EVENING Patient taking differently: Take 1,000 mg by mouth 2 (two) times daily with a meal.  02/22/14  Yes [name], MD  Multiple Vitamin (MULTIVITAMIN) tablet Take 1 tablet by mouth daily.     Yes Historical Provider, MD  simvastatin (ZOCOR) 40 MG tablet TAKE ONE TABLET BY MOUTH AT BEDTIME  Patient taking differently: Take 40 mg by mouth at bedtime.  02/22/14  Yes [name], MD  fish oil-omega-3 fatty acids 1000 MG capsule Take 1 g by mouth 4 (four) times daily.      Historical Provider, MD  fluticasone (FLONASE) 50 MCG/ACT nasal spray Place 1-2 sprays into both nostrils daily. Please fill upon patient request Patient not taking: Reported on 07/02/2014 02/22/14 03/17/15  [name], MD  glucose blood (ONE TOUCH ULTRA TEST) test strip Test blood sugar tree times daily as instructed by physician.  Dx: 250.00, h/o fluctuating sugars, elevated sugars. 05/04/14    [name], MD  Lancets [facility] ULTRASOFT) lancets Test blood sugar twice daily as instructed by physician.  Dx: 250.00 08/30/11   [name], MD  loratadine (ALAVERT) 10 MG tablet Take 10 mg by mouth daily.      Historical Provider, MD  traMADol (ULTRAM) 50 MG tablet Take 1 tablet (50 mg total) by mouth every 8 (eight) hours as needed for moderate pain (sedation caution). Patient not taking: Reported on 07/02/2014 02/26/14   [name], MD   BP 116/80 mmHg  Pulse 72  Temp(Src) 97.3 F (36.3 C) (Oral)  Resp 20  Ht 5\' 6"  (1.676 m)  Wt 200 lb (90.719 kg)  BMI 32.30 kg/m2  SpO2 97% Physical Exam  Constitutional: He is oriented to person, place, and time. He appears well-developed and well-nourished. No distress.  HENT:  Head: Normocephalic and atraumatic.  Mouth/Throat: Oropharynx is clear and moist.  Eyes: Conjunctivae and EOM are normal. Pupils are equal, round, and reactive to light.  Neck: Normal range of motion. Neck supple.  Cardiovascular: Normal rate and regular rhythm.   Pulmonary/Chest: Effort normal and breath sounds normal. No respiratory distress. He has no wheezes. He has no rales.  Abdominal: Soft. Bowel sounds are normal. He exhibits no distension and no mass. There is no tenderness. There is no rebound and no guarding.  Genitourinary:  Bilateral testes in normal lie. Mild right-sided testicular tenderness. No appreciable masses. No hernias. Bilateral cremasteric reflex.  Uncircumcised penis. No penile discharge.  Musculoskeletal: Normal range of motion. He exhibits no edema or tenderness.  Neurological: He is alert and oriented to person, place, and time.  Skin: Skin is warm and dry. No rash noted. No erythema.  Psychiatric: He has a normal mood and affect. His behavior is normal.  Nursing note and vitals reviewed.   ED Course  Procedures (including critical care time) DIAGNOSTIC STUDIES: Oxygen Saturation is 97% on room air, normal by my  interpretation.    COORDINATION OF CARE: 12:04 PM Discussed treatment plan with patient at beside, the patient agrees with the plan and has no further questions at this time.   Labs Review Labs Reviewed  COMPREHENSIVE METABOLIC PANEL - Abnormal; Notable for the following:    Glucose, Bld 181 (*)    All other components within normal limits  GC/CHLAMYDIA PROBE AMP  CBC WITH DIFFERENTIAL  URINALYSIS, ROUTINE W REFLEX MICROSCOPIC  HIV ANTIBODY (ROUTINE TESTING)    Imaging Review US Scrotum  07/02/2014   CLINICAL DATA:  Right testicular pain.  EXAM: SCROTAL ULTRASOUND  DOPPLER ULTRASOUND OF THE TESTICLES  TECHNIQUE: Complete ultrasound examination of the testicles, epididymis, and other scrotal structures was performed. Color and spectral Doppler ultrasound were also utilized to evaluate blood flow to the testicles.  COMPARISON:  None.  FINDINGS: Right testicle  Measurements: 4.0 x 3.1 x 2.4 cm. No mass or microlithiasis visualized.  Left testicle  Measurements: 4.1 x 2.8 x 2.7 cm. No mass or microlithiasis visualized.  Right epididymis:  Normal in size and appearance.  Left epididymis:  Normal in size and appearance.  Hydrocele:  None visualized.  Varicocele:  None visualized.  Pulsed Doppler interrogation of both testes demonstrates low resistance arterial and venous waveforms bilaterally.  There appears to be a 9 mm hypoechoic abnormality with increased flow on Doppler in the superior portion of the right scrotum. This is where the patient is focally tender.  IMPRESSION: No evidence of testicular torsion or mass.  9 mm hypoechoic rounded abnormality seen in the superior portion of the right scrotum in area where patient is focally tender. Doppler demonstrates increased flow around this abnormality. This may represent focal inflammation or possibly neoplasm. Clinical correlation and follow-up ultrasound is recommended.   Electronically Signed   By: Sabino Dick M.D.   On: 07/02/2014 02:03   [place]  Art/ven Flow Abd Pelv Doppler  07/02/2014   CLINICAL DATA:  Right testicular pain.  EXAM: SCROTAL ULTRASOUND  DOPPLER ULTRASOUND OF THE TESTICLES  TECHNIQUE: Complete ultrasound examination of the testicles, epididymis, and other scrotal structures was performed. Color and spectral Doppler ultrasound were also utilized to evaluate blood flow to the testicles.  COMPARISON:  None.  FINDINGS: Right testicle  Measurements: 4.0 x 3.1 x 2.4 cm. No mass or microlithiasis visualized.  Left testicle  Measurements: 4.1 x 2.8 x 2.7 cm. No mass or microlithiasis visualized.  Right epididymis:  Normal in size and appearance.  Left epididymis:  Normal in size and appearance.  Hydrocele:  None visualized.  Varicocele:  None visualized.  Pulsed Doppler interrogation of both testes demonstrates low resistance arterial and venous waveforms bilaterally.  There appears to be a 9 mm hypoechoic abnormality with increased flow on Doppler in the superior portion of the right scrotum. This is where the patient is focally tender.  IMPRESSION: No evidence of testicular torsion or mass.  9 mm hypoechoic rounded abnormality seen in the superior portion of the right scrotum in area where patient is focally tender. Doppler demonstrates increased flow around this abnormality. This may represent focal inflammation or possibly neoplasm. Clinical correlation and follow-up ultrasound is recommended.   Electronically Signed   By: Sabino Dick M.D.   On: 07/02/2014 02:03     EKG Interpretation None      MDM   Final diagnoses:  Right testicular pain    I personally performed the services described in this documentation, which was scribed in my presence. The recorded information has been reviewed and is accurate.   Patient is resting comfortably. Discussed with patient the results of the testicular ultrasound. The patient understands that he needs to follow up closely with urology. Advised to call in the morning to arrange outpatient  follow-up. He's been given return precautions and is voiced understanding.  Gordon Rice, MD 07/02/14 [phone]

## 2014-07-01 NOTE — ED Notes (Signed)
Patient here with complaint of right testicle pain. States that yesterday his scrotum began to swell and both testicles were painful at that time. Now the pain is primarily in the right testicle and accompanied by RUQ and RLQ abdominal pain. Additionally reports nausea with emesis x1.

## 2014-07-02 ENCOUNTER — Encounter: Payer: Self-pay | Admitting: Family Medicine

## 2014-07-02 ENCOUNTER — Emergency Department (HOSPITAL_COMMUNITY): Payer: BC Managed Care – PPO

## 2014-07-02 ENCOUNTER — Telehealth: Payer: Self-pay | Admitting: Family Medicine

## 2014-07-02 ENCOUNTER — Ambulatory Visit (INDEPENDENT_AMBULATORY_CARE_PROVIDER_SITE_OTHER): Payer: BC Managed Care – PPO | Admitting: Family Medicine

## 2014-07-02 ENCOUNTER — Other Ambulatory Visit (INDEPENDENT_AMBULATORY_CARE_PROVIDER_SITE_OTHER): Payer: BC Managed Care – PPO

## 2014-07-02 VITALS — BP 124/84 | HR 72 | Temp 98.6°F | Wt 203.2 lb

## 2014-07-02 DIAGNOSIS — N50819 Testicular pain, unspecified: Secondary | ICD-10-CM | POA: Insufficient documentation

## 2014-07-02 DIAGNOSIS — E119 Type 2 diabetes mellitus without complications: Secondary | ICD-10-CM

## 2014-07-02 DIAGNOSIS — N508 Other specified disorders of male genital organs: Secondary | ICD-10-CM

## 2014-07-02 DIAGNOSIS — R21 Rash and other nonspecific skin eruption: Secondary | ICD-10-CM

## 2014-07-02 DIAGNOSIS — R12 Heartburn: Secondary | ICD-10-CM

## 2014-07-02 LAB — URINALYSIS, ROUTINE W REFLEX MICROSCOPIC
Bilirubin Urine: NEGATIVE
GLUCOSE, UA: NEGATIVE mg/dL
Hgb urine dipstick: NEGATIVE
Ketones, ur: NEGATIVE mg/dL
LEUKOCYTES UA: NEGATIVE
NITRITE: NEGATIVE
Protein, ur: NEGATIVE mg/dL
SPECIFIC GRAVITY, URINE: 1.021 (ref 1.005–1.030)
Urobilinogen, UA: 0.2 mg/dL (ref 0.0–1.0)
pH: 5.5 (ref 5.0–8.0)

## 2014-07-02 LAB — COMPREHENSIVE METABOLIC PANEL
ALT: 34 U/L (ref 0–53)
ANION GAP: 15 (ref 5–15)
AST: 22 U/L (ref 0–37)
Albumin: 3.8 g/dL (ref 3.5–5.2)
Alkaline Phosphatase: 45 U/L (ref 39–117)
BUN: 13 mg/dL (ref 6–23)
CO2: 21 mEq/L (ref 19–32)
Calcium: 9.6 mg/dL (ref 8.4–10.5)
Chloride: 102 mEq/L (ref 96–112)
Creatinine, Ser: 0.77 mg/dL (ref 0.50–1.35)
GFR calc Af Amer: >90 mL/min (ref >90)
GFR calc non Af Amer: >90 mL/min (ref >90)
Glucose, Bld: 181 mg/dL — ABNORMAL HIGH (ref 70–99)
Potassium: 4.6 mEq/L (ref 3.7–5.3)
SODIUM: 138 meq/L (ref 137–147)
TOTAL PROTEIN: 7 g/dL (ref 6.0–8.3)
Total Bilirubin: 0.3 mg/dL (ref 0.3–1.2)

## 2014-07-02 LAB — HEMOGLOBIN A1C: HEMOGLOBIN A1C: 7.7 % — AB (ref 4.6–6.5)

## 2014-07-02 LAB — HIV ANTIBODY (ROUTINE TESTING W REFLEX): HIV: NONREACTIVE

## 2014-07-02 MED ORDER — IBUPROFEN 200 MG PO TABS
600.0000 mg | ORAL_TABLET | Freq: Once | ORAL | Status: AC
  Administered 2014-07-02: 600 mg via ORAL
  Filled 2014-07-02: qty 3

## 2014-07-02 MED ORDER — IBUPROFEN 600 MG PO TABS: 600.0000 mg | ORAL_TABLET | Freq: Four times a day (QID) | ORAL | Status: DC | PRN

## 2014-07-02 MED ORDER — KETOCONAZOLE 2 % EX CREA: 1.0000 "application " | TOPICAL_CREAM | Freq: Every day | CUTANEOUS | Status: DC

## 2014-07-02 MED ORDER — TRAMADOL HCL 50 MG PO TABS: 50.0000 mg | ORAL_TABLET | Freq: Four times a day (QID) | ORAL | Status: DC | PRN

## 2014-07-02 MED ORDER — TRAMADOL HCL 50 MG PO TABS
50.0000 mg | ORAL_TABLET | Freq: Once | ORAL | Status: AC
  Administered 2014-07-02: 50 mg via ORAL
  Filled 2014-07-02: qty 1

## 2014-07-02 NOTE — Discharge Instructions (Signed)
Call and make an appointment to follow-up with the urologist. Return immediately for worsening pain, persistent vomiting, increased scrotal swelling or for any concerns   Scrotal Masses Scrotal swelling is common in men of all ages. Common types of testicular masses include:   Hydrocele. The most common benign testicular mass in an adult. Hydroceles are generally soft and painless collections of fluid in the scrotal sac. These can rapidly change size as the fluid enters or leaves. Hydroceles can be associated with an underlying cancer of the testicle.  Spermatoceles. Generally soft and painless cyst-like masses in the scrotum that contain fluid, usually above the testicle. They can rapidly change size as the fluid enters or leaves. They are more prominent while standing or exercising. Sometimes, spermatoceles may cause a sensation of heaviness or a dull ache.  Orchitis. Inflammation of the testicle. It is painful and may be associated with a fever or symptoms of a urinary tract infection, including frequent and painful urination. It is common in males who have the mumps.  Varicocele. An enlargement of the veins that drain the testicles. Varicoceles usually occur on the left side of the scrotum. This condition can increase the risk of infertility. Varicocele is sometimes more prominent while standing or exercising. Sometimes, varicoceles may cause a sensation of heaviness or a dull ache.  Inguinal hernia. A bulge caused by a portion of intestine protruding into the scrotum through a weak area in the abdominal muscles. Hernias may or may not be painful. They are soft and usually enlarge with coughing or straining.  Torsion of the testis. This can cause a testicular mass that develops quickly and is associated with tenderness or fever, or both. It is caused by a twisting of the testicle within the sac. It also reduces the blood supply and can destroy the testis if not treated quickly with  surgery.  Epididymitis. Inflammation of the epididymis (a structure attached above and behind the testicle), usually caused by a urinary tract infection or a sexually transmitted infection. This generally shows up as testicular discomfort and swelling and may include pain during urination. It is frequently associated with a testicle infection.  Testicular appendages. Remnants of tissue on the testis present since birth. A testicular appendage can twist on its blood supply and cause pain. In most cases, this is seen as a blue dot on the scrotum.  Hematocele. A collection of blood between the layers of the sac inside the scrotum. It usually is caused by trauma to the scrotum.  Sebaceous cysts. These can be a swelling in the skin of the scrotum and are usually painless.  Cancer (carcinoma) of the skin of the scrotum. It can cause scrotal swelling, but this is rare. Document Released: 01/06/2003 Document Revised: 03/04/2013 Document Reviewed: 12/22/2012 [facility] Patient Information 2015 [place], [place]. This information is not intended to replace advice given to you by your health care provider. Make sure you discuss any questions you have with your health care provider.

## 2014-07-02 NOTE — Assessment & Plan Note (Signed)
Report reviewed with patient.  Continue ibuprofen and tramadol.  D/w pt. He agrees.  Some better now.  Refer to uro.  He agrees.

## 2014-07-02 NOTE — Patient Instructions (Addendum)
Go to the lab on the way out.  We'll contact you with your lab report (future order for A1c). Rosaria Ferries will call about your referral. You can change to prilosec 20mg  a day and stop the zantac if needed.  Take the tramadol and ibuprofen for pain in the meantime.  Use ketoconazole on the rash until it is resolved and then for a few more days, to try to keep it from coming back.  Take care.  Glad to see you.

## 2014-07-02 NOTE — Progress Notes (Signed)
Pre visit review using our clinic review tool, if applicable. No additional management support is needed unless otherwise documented below in the visit note.  Started with R sided abd pain and R testicle pain.  Recently with groin swelling, improved with icing.  To ER last night, due to inc in pain.  No dysuria.  No fevers.  He had vomited on Wednesday, none since, it was an isolated event.  He didn't know if he pulled a muscle when vomiting.    U/s report d/w pt.    No evidence of testicular torsion or mass.  9 mm hypoechoic rounded abnormality seen in the superior portion of the right scrotum in area where patient is focally tender. Doppler demonstrates increased flow around this abnormality. This may represent focal inflammation or possibly neoplasm. Clinical correlation and follow-up ultrasound is recommended.  He was given dose of tramadol and nsaid at ER.  Has rx for both.  Pain is some better now.    GERD. He's had more reflux sx in the meantime, predating nsaid use.  Ranitidine 150mg  BID helped a little in the meantime.   It used to be triggered by certain foods, triggered by most foods now.  His separation is noted, and that may be contributing.    DM2. Sugar has been ~150s in the AM recently.  Was prev in the 130s before his pain got worse recently.  Due for labs.  Compliant with meds.  No ADEs.   Meds, vitals, and allergies reviewed.   ROS: See HPI.  Otherwise, noncontributory.  GEN: nad, alert and oriented HEENT: mucous membranes moist NECK: supple w/o LA CV: rrr PULM: ctab, no inc wob ABD: soft, +bs EXT: no edema SKIN: tinea in L groin.  scrotal exam: tender on R side, not swollen (much improved per patient report)

## 2014-07-02 NOTE — Assessment & Plan Note (Signed)
Recheck A1c today, see notes on labs.  No changes in meds at this point. He agrees.

## 2014-07-02 NOTE — ED Notes (Signed)
Patient still off the unit for testing 

## 2014-07-02 NOTE — Telephone Encounter (Signed)
Patient already has 8:15 am appt today.

## 2014-07-02 NOTE — ED Notes (Signed)
Dr. yelverton at the bedside.  

## 2014-07-02 NOTE — Assessment & Plan Note (Signed)
Can change to PPI if needed, d/w pt.  See AVS. Mult stressors noted, separation, pain, etc.

## 2014-07-02 NOTE — Telephone Encounter (Signed)
This was addressed at [place].  No need to f/u.  Thanks.

## 2014-07-02 NOTE — Assessment & Plan Note (Signed)
Use ketoconazole, he wasn't able to get it prev.  rx printed for patient.

## 2014-07-02 NOTE — Telephone Encounter (Signed)
Got the note that patient was in the ER.  If he needs referral to get set up with uro, then let me know.  O/w I'll await the urology notes.  Thanks.

## 2014-07-03 LAB — GC/CHLAMYDIA PROBE AMP
CT Probe RNA: NEGATIVE
GC Probe RNA: NEGATIVE

## 2014-10-01 ENCOUNTER — Other Ambulatory Visit: Payer: BC Managed Care – PPO

## 2014-10-03 ENCOUNTER — Other Ambulatory Visit: Payer: Self-pay | Admitting: Family Medicine

## 2014-10-03 DIAGNOSIS — E119 Type 2 diabetes mellitus without complications: Secondary | ICD-10-CM

## 2014-10-07 ENCOUNTER — Other Ambulatory Visit (INDEPENDENT_AMBULATORY_CARE_PROVIDER_SITE_OTHER): Payer: BC Managed Care – PPO

## 2014-10-07 ENCOUNTER — Encounter: Payer: BC Managed Care – PPO | Admitting: Family Medicine

## 2014-10-07 DIAGNOSIS — R7989 Other specified abnormal findings of blood chemistry: Secondary | ICD-10-CM

## 2014-10-07 DIAGNOSIS — E119 Type 2 diabetes mellitus without complications: Secondary | ICD-10-CM

## 2014-10-07 LAB — HEMOGLOBIN A1C: HEMOGLOBIN A1C: 9.6 % — AB (ref 4.6–6.5)

## 2014-10-07 LAB — LDL CHOLESTEROL, DIRECT: Direct LDL: 89 mg/dL

## 2014-10-07 LAB — LIPID PANEL
Cholesterol: 243 mg/dL — ABNORMAL HIGH (ref 0–200)
HDL: 36.5 mg/dL — AB (ref >39.00)
Total CHOL/HDL Ratio: 7
Triglycerides: 784 mg/dL — ABNORMAL HIGH (ref 0.0–149.0)

## 2014-10-08 ENCOUNTER — Other Ambulatory Visit: Payer: BC Managed Care – PPO

## 2014-10-15 ENCOUNTER — Encounter: Payer: Self-pay | Admitting: Family Medicine

## 2014-10-15 ENCOUNTER — Ambulatory Visit (INDEPENDENT_AMBULATORY_CARE_PROVIDER_SITE_OTHER): Payer: BC Managed Care – PPO | Admitting: Family Medicine

## 2014-10-15 VITALS — BP 144/88 | HR 89 | Temp 98.6°F | Ht 66.0 in | Wt 201.0 lb

## 2014-10-15 DIAGNOSIS — E119 Type 2 diabetes mellitus without complications: Secondary | ICD-10-CM

## 2014-10-15 DIAGNOSIS — Z7189 Other specified counseling: Secondary | ICD-10-CM

## 2014-10-15 DIAGNOSIS — I10 Essential (primary) hypertension: Secondary | ICD-10-CM

## 2014-10-15 DIAGNOSIS — Z0000 Encounter for general adult medical examination without abnormal findings: Secondary | ICD-10-CM

## 2014-10-15 DIAGNOSIS — E782 Mixed hyperlipidemia: Secondary | ICD-10-CM | POA: Diagnosis not present

## 2014-10-15 NOTE — Patient Instructions (Signed)
Take care.  Glad to see you.  Cut out the carbs and try to get more exercise.  Recheck lipids and A1c in about 3 months- fasting lab visit before an office visit.  If your sugar isn't coming down in the meantime, then let me know.

## 2014-10-15 NOTE — Progress Notes (Signed)
Pre visit review using our clinic review tool, if applicable. No additional management support is needed unless otherwise documented below in the visit note.  CPE- See plan.  Routine anticipatory guidance given to patient.  See health maintenance. Tetanus 2010 Flu 04/21/14 at work PNA 2015 Shingles not due Colon and prostate cancer screening not due Living will d/w pt.  Parents designated if patient were incapacitated.   D&E d/w pt.  "Alright" on both.  Had been eating more biscuits and "not watching what I'm eating."   He had uro f/u in 07/2014 and had normal exam.  No residual sx.    Diabetes:  Using medications without difficulties: yes Hypoglycemic episodes:no Hyperglycemic episodes: 200s initially, more recently in 160s in AM Feet problems:no Blood Sugars averaging:see above eye exam within last year: yes A1c up.  D/w pt along with diet.   Hypertension:    Using medication without problems or lightheadedness: yes Chest pain with exertion:no Edema:no Short of breath:no  Elevated Cholesterol: Using medications without problems:yes Muscle aches: no Diet compliance: see above Exercise: "not any lately."  Other complaints:  PMH and SH reviewed.   Vital signs, Meds and allergies reviewed.  ROS: See HPI.  Otherwise nontributory.   GEN: nad, alert and oriented HEENT: mucous membranes moist NECK: supple w/o LA CV: rrr.  no murmur PULM: ctab, no inc wob ABD: soft, +bs EXT: no edema SKIN: no acute rash  Diabetic foot exam: Normal inspection No skin breakdown Calluses noted on the B 1st toes.  Normal DP pulses Normal sensation to light tough and monofilament Nails normal

## 2014-10-17 DIAGNOSIS — Z7189 Other specified counseling: Secondary | ICD-10-CM | POA: Insufficient documentation

## 2014-10-17 NOTE — Assessment & Plan Note (Signed)
Routine anticipatory guidance given to patient.  See health maintenance. Tetanus 2010 Flu 04/21/14 at work PNA 2015 Shingles not due Colon and prostate cancer screening not due Living will d/w pt.  Parents designated if patient were incapacitated.   D&E d/w pt.  "Alright" on both.  Had been eating more biscuits and "not watching what I'm eating."

## 2014-10-17 NOTE — Assessment & Plan Note (Signed)
Up today.  The issue is his weight, he'll work on weight loss with diet and exercise.  D/w pt.  He agrees.  No change in meds  Today.

## 2014-10-17 NOTE — Assessment & Plan Note (Signed)
A1c up. The issue is his weight, he'll work on weight loss with diet and exercise.  D/w pt.  He wanted another trial before additional meds added.  I relented and he'll work on diet/weight.  He'll update me if sugar isn't better in the meantime.  If improving, then recheck in about 3 months.  He agrees.

## 2014-10-17 NOTE — Assessment & Plan Note (Signed)
TG up. The issue is his weight, he'll work on weight loss with diet and exercise.  D/w pt.  He agrees.  No change in meds today.

## 2014-10-18 ENCOUNTER — Telehealth: Payer: Self-pay | Admitting: *Deleted

## 2014-10-18 NOTE — Telephone Encounter (Addendum)
Patient asks that you call him when you can in reference to his sugar. Thanks! [place]

## 2014-10-19 MED ORDER — GLIMEPIRIDE 2 MG PO TABS: 2.0000 mg | ORAL_TABLET | Freq: Every day | ORAL | Status: DC

## 2014-10-19 NOTE — Telephone Encounter (Signed)
Called pt.  Still with elevated sugars, none <150 in AM.  Has some >200.  D/w pt.  Continue metformin, add on glimepiride.  Start with 1 tab a day, inc to 2 tabs in AM if sugar stays elevated. He'll update me in about 1-2 weeks, sooner if needed.  He agrees.  Advised patient to take med in AM with food.

## 2015-01-05 ENCOUNTER — Telehealth: Payer: Self-pay | Admitting: *Deleted

## 2015-01-05 NOTE — Telephone Encounter (Signed)
Patient called to report potential side effects to glimepiride.  He has been taking this medication for three months and has been experiencing worsening headaches since starting it.  They started out once a week and now he is having them every day.  He would like to discuss an alternate medication if possible.  Please Advise.

## 2015-01-06 MED ORDER — SITAGLIPTIN PHOSPHATE 100 MG PO TABS: 100.0000 mg | ORAL_TABLET | Freq: Every day | ORAL | Status: DC

## 2015-01-06 MED ORDER — GLIMEPIRIDE 2 MG PO TABS: ORAL_TABLET | ORAL | Status: DC

## 2015-01-06 NOTE — Telephone Encounter (Signed)
Stay off med and change to [place].  Continue metformin.  rx sent.  Update me as needed.  Thanks for the update.  I'm glad he called and I'm glad he is doing better.

## 2015-01-06 NOTE — Telephone Encounter (Signed)
Stop the med, see if the HA gets better, and then update me in a few days.   How has his sugar been running? Thanks.

## 2015-01-06 NOTE — Telephone Encounter (Signed)
Patient advised.   Patient says he did not take the medication this morning and he is already feeling better.  His head is not hurting for the first time in a week.  His blood sugar was 159 yesterday morning 2 hour PP and has been at about that same number consistently.

## 2015-01-06 NOTE — Telephone Encounter (Signed)
Patient advised.

## 2015-01-12 ENCOUNTER — Other Ambulatory Visit (INDEPENDENT_AMBULATORY_CARE_PROVIDER_SITE_OTHER): Payer: BC Managed Care – PPO

## 2015-01-12 DIAGNOSIS — E119 Type 2 diabetes mellitus without complications: Secondary | ICD-10-CM

## 2015-01-12 LAB — LDL CHOLESTEROL, DIRECT: LDL DIRECT: 80 mg/dL

## 2015-01-12 LAB — LIPID PANEL
Cholesterol: 157 mg/dL (ref 0–200)
HDL: 35.3 mg/dL — AB (ref >39.00)
NONHDL: 121.7
Total CHOL/HDL Ratio: 4
Triglycerides: 339 mg/dL — ABNORMAL HIGH (ref 0.0–149.0)
VLDL: 67.8 mg/dL — AB (ref 0.0–40.0)

## 2015-01-12 LAB — HEMOGLOBIN A1C: Hgb A1c MFr Bld: 7.6 % — ABNORMAL HIGH (ref 4.6–6.5)

## 2015-01-14 ENCOUNTER — Encounter: Payer: Self-pay | Admitting: Family Medicine

## 2015-01-14 ENCOUNTER — Ambulatory Visit (INDEPENDENT_AMBULATORY_CARE_PROVIDER_SITE_OTHER): Payer: BC Managed Care – PPO | Admitting: Family Medicine

## 2015-01-14 VITALS — BP 114/80 | HR 70 | Temp 98.4°F | Wt 207.5 lb

## 2015-01-14 DIAGNOSIS — E119 Type 2 diabetes mellitus without complications: Secondary | ICD-10-CM | POA: Diagnosis not present

## 2015-01-14 MED ORDER — SITAGLIPTIN PHOSPHATE 100 MG PO TABS: 100.0000 mg | ORAL_TABLET | Freq: Every day | ORAL | Status: DC

## 2015-01-14 NOTE — Progress Notes (Signed)
Pre visit review using our clinic review tool, if applicable. No additional management support is needed unless otherwise documented below in the visit note.  Diabetes:  Using medications without difficulties:yes HA resolved off glimepiride.  No ADE on meds.  Hypoglycemic episodes:no Hyperglycemic episodes:no Feet problems:no Blood Sugars averaging: ~100-140 now eye exam within last year: yes, but due soon, he has f/u pending.  A1c much improved.  D/w pt.   Diet and exercise d/w pt. Cut out biscuits.   He feels better.  TG improved.    Meds, vitals, and allergies reviewed.   ROS: See HPI.  Otherwise negative.    GEN: nad, alert and oriented HEENT: mucous membranes moist NECK: supple w/o LA CV: rrr. PULM: ctab, no inc wob ABD: soft, +bs EXT: no edema SKIN: no acute rash  Diabetic foot exam: Normal inspection No skin breakdown Calluses noted on the B 1st toes.  Normal DP pulses Normal sensation to light touch and monofilament Nails normal

## 2015-01-14 NOTE — Patient Instructions (Signed)
Take care.  Glad to see you.   Recheck labs in about 4 months before a visit.  Keep working on your weight.

## 2015-01-18 NOTE — Assessment & Plan Note (Signed)
Blood Sugars averaging: ~100-140 now  eye exam within last year: yes, but due soon, he has f/u pending.  A1c much improved. D/w pt.  Diet and exercise d/w pt. Cut out biscuits.  He feels better. TG improved.  Recheck labs in about 4 months before a visit.  Keep working on diet, d/w pt.

## 2015-01-31 ENCOUNTER — Encounter: Payer: Self-pay | Admitting: Family Medicine

## 2015-01-31 ENCOUNTER — Ambulatory Visit (INDEPENDENT_AMBULATORY_CARE_PROVIDER_SITE_OTHER): Payer: BC Managed Care – PPO | Admitting: Family Medicine

## 2015-01-31 VITALS — BP 114/82 | HR 79 | Temp 98.6°F | Wt 212.5 lb

## 2015-01-31 DIAGNOSIS — R21 Rash and other nonspecific skin eruption: Secondary | ICD-10-CM | POA: Diagnosis not present

## 2015-01-31 MED ORDER — TRIAMCINOLONE ACETONIDE 0.5 % EX CREA: 1.0000 "application " | TOPICAL_CREAM | Freq: Two times a day (BID) | CUTANEOUS | Status: DC | PRN

## 2015-01-31 NOTE — Patient Instructions (Signed)
This doesn't look infected.  Use the cream and it should get better.   Take care.  Glad to see you.

## 2015-01-31 NOTE — Progress Notes (Signed)
Pre visit review using our clinic review tool, if applicable. No additional management support is needed unless otherwise documented below in the visit note.  Rash on R hand, dorsum. No poison ivy exposure.  No FCNAVD.  No other rash.  No palm sx.  Itches.  No clear trigger.  No new soaps.  Recently started on januvia, but that was weeks ago and not a likely cause.    Meds, vitals, and allergies reviewed.   ROS: See HPI.  Otherwise, noncontributory.  nad Blanching red rash, irregular on the dorsum of the R hand. No palmar lesion.  No L hand or other B arm sx.

## 2015-02-01 NOTE — Assessment & Plan Note (Signed)
Looks like contact dermatitis, d/w pt.  No clear trigger known.  Would use topical TAC and fu prn.  He agrees.

## 2015-02-25 LAB — HM DIABETES EYE EXAM

## 2015-03-07 ENCOUNTER — Other Ambulatory Visit: Payer: Self-pay | Admitting: Family Medicine

## 2015-03-07 MED ORDER — METFORMIN HCL 500 MG PO TABS: ORAL_TABLET | ORAL | Status: DC

## 2015-03-07 MED ORDER — LISINOPRIL 10 MG PO TABS: ORAL_TABLET | ORAL | Status: DC

## 2015-03-07 MED ORDER — SIMVASTATIN 40 MG PO TABS: ORAL_TABLET | ORAL | Status: DC

## 2015-03-07 NOTE — Telephone Encounter (Signed)
Pt request 90 day refill lisinopril,metformin and simvastatin to [facility] [place]. Per protocol pt advised done.

## 2015-03-08 ENCOUNTER — Encounter: Payer: Self-pay | Admitting: Family Medicine

## 2015-05-13 ENCOUNTER — Other Ambulatory Visit: Payer: Self-pay | Admitting: Family Medicine

## 2015-05-13 DIAGNOSIS — E119 Type 2 diabetes mellitus without complications: Secondary | ICD-10-CM

## 2015-05-18 ENCOUNTER — Other Ambulatory Visit (INDEPENDENT_AMBULATORY_CARE_PROVIDER_SITE_OTHER): Payer: BC Managed Care – PPO

## 2015-05-18 DIAGNOSIS — E119 Type 2 diabetes mellitus without complications: Secondary | ICD-10-CM | POA: Diagnosis not present

## 2015-05-18 LAB — HEMOGLOBIN A1C: Hgb A1c MFr Bld: 8 % — ABNORMAL HIGH (ref 4.6–6.5)

## 2015-05-19 ENCOUNTER — Encounter: Payer: Self-pay | Admitting: Family Medicine

## 2015-05-19 ENCOUNTER — Ambulatory Visit (INDEPENDENT_AMBULATORY_CARE_PROVIDER_SITE_OTHER): Payer: BC Managed Care – PPO | Admitting: Family Medicine

## 2015-05-19 DIAGNOSIS — E119 Type 2 diabetes mellitus without complications: Secondary | ICD-10-CM

## 2015-05-19 DIAGNOSIS — Z9852 Vasectomy status: Secondary | ICD-10-CM | POA: Diagnosis not present

## 2015-05-19 NOTE — Progress Notes (Signed)
Pre visit review using our clinic review tool, if applicable. No additional management support is needed unless otherwise documented below in the visit note.  Diabetes:  Using medications without difficulties: yes Hypoglycemic episodes:no Hyperglycemic episodes:no Feet problems:no Blood Sugars averaging: usually ~170s recently in the AM, occ lower.   eye exam within last year:yes A1c up, d/w pt.   Flu shot done 04/25/15.   D/w pt about diet and exercise.  He needs improvement with both.  D/w pt.  He agrees.    He has some occ testicle irritation that improved with occ ibuprofen use.  He is clearly improved today.  No fevers, chills, no dysuria.  He was checked prev, last year, for similar results.  He was asking about vasectomy reversal.  He is engaged to be married in 09/2015.   Meds, vitals, and allergies reviewed.   ROS: See HPI.  Otherwise negative.    GEN: nad, alert and oriented HEENT: mucous membranes moist NECK: supple w/o LA CV: rrr. PULM: ctab, no inc wob ABD: soft, +bs EXT: no edema  Diabetic foot exam: Normal inspection No skin breakdown No calluses  Normal DP pulses Normal sensation to light touch and monofilament Nails normal

## 2015-05-19 NOTE — Patient Instructions (Signed)
Rosaria Ferries will call about your referral. Recheck labs in about 4-5 months before a physical. Work on your diet and weight in the meantime.   Take care.  Glad to see you.

## 2015-05-20 DIAGNOSIS — Z9852 Vasectomy status: Secondary | ICD-10-CM | POA: Insufficient documentation

## 2015-05-20 NOTE — Assessment & Plan Note (Signed)
A1c up, d/w pt.  Flu shot done 04/25/15.  D/w pt about diet and exercise. He needs improvement with both. D/w pt. He agrees.  Recheck labs in about 4-5 months before a physical.  Work on diet and weight in the meantime.  No med change at this point.  He agrees.

## 2015-05-20 NOTE — Assessment & Plan Note (Signed)
Refer back to uro.

## 2015-06-07 ENCOUNTER — Telehealth: Payer: Self-pay

## 2015-06-07 MED ORDER — CYCLOBENZAPRINE HCL 10 MG PO TABS: 5.0000 mg | ORAL_TABLET | Freq: Three times a day (TID) | ORAL | Status: DC | PRN

## 2015-06-07 NOTE — Telephone Encounter (Signed)
I would try flexeril, sedation caution.  rx sent.  F/u if not better.  Thanks.

## 2015-06-07 NOTE — Telephone Encounter (Signed)
Patient advised.

## 2015-06-07 NOTE — Telephone Encounter (Signed)
Pt left v/m; when seen 05/19/15 pt thought he discussed having back pain that has worsened upon exertion or lifting. Pt having back pain at base of lungs on rt side. No cough. Pt wants to know if there is a med that can be called in for back pain to [address] . Pt taking ibuprofen 600 mg at nighttime but is not effective against pain; pt also tried heat to back with no success. Pt request cb.

## 2015-07-26 ENCOUNTER — Telehealth: Payer: Self-pay | Admitting: Family Medicine

## 2015-07-26 NOTE — Telephone Encounter (Signed)
Patient says that for the past 2-3 weeks, he has noticed that his fasting BS (am) has been in the 200's to 300's.   He has also noticed in this time that anywhere from 3-5 hours post prandial, it is in the 300's.  Patient states he has taken all of his medications as prescribed and does not feel that his diet has changed significantly (even through the holidays).  Patient says the only change has been the [place].  Please advise.

## 2015-07-26 NOTE — Telephone Encounter (Signed)
Patient is agreeable to the insulin.  Please send in the Rx along with the supplies that he will need to administer it.  Patient advised to eliminate carbs and drink lots of water in the meantime and to schedule an OV for teaching once he picks up his insulin.

## 2015-07-26 NOTE — Telephone Encounter (Signed)
At this point, he has maxed out two oral meds.  I think he has gotten to the point of needing insulin.   If we put him on another oral med, it will likely be really expensive and not enough to get him controlled.  Insulin makes the most sense . Try to eliminate all carbs for now, drink plenty of water, and see if he'll agree with the insulin.  If so (one shot a day), I can send it in.  He can bring to OV and we'll do the teaching here.  I think he would get better results more quickly with that than anything else.  Let me know.  Thanks.

## 2015-07-26 NOTE — Telephone Encounter (Signed)
Pt requesting cb regarding blood sugar cb number is [phone]

## 2015-07-27 MED ORDER — INSULIN PEN NEEDLE 31G X 5 MM MISC: Status: DC

## 2015-07-27 MED ORDER — INSULIN GLARGINE 100 UNIT/ML SOLOSTAR PEN: 5.0000 [IU] | PEN_INJECTOR | Freq: Every day | SUBCUTANEOUS | Status: DC

## 2015-07-27 NOTE — Telephone Encounter (Signed)
Sent.  Thanks.   Needs OV scheduled when he has the rx.

## 2015-07-27 NOTE — Telephone Encounter (Signed)
Pt returned your call - please call [phone] Thank you

## 2015-07-27 NOTE — Telephone Encounter (Signed)
Patient notified as instructed by telephone and verbalized understanding. Appointment scheduled for 07/28/15.

## 2015-07-28 ENCOUNTER — Ambulatory Visit: Payer: BC Managed Care – PPO | Admitting: Family Medicine

## 2015-07-28 ENCOUNTER — Telehealth: Payer: Self-pay | Admitting: Family Medicine

## 2015-07-28 NOTE — Telephone Encounter (Signed)
Please call him back.  Thanks.

## 2015-07-28 NOTE — Telephone Encounter (Signed)
Patient just wanted to be sure that the needles for the pens came with the pens.  I explained that they do not "come with" the pens but is a separate Rx and both have been sent to the pharmacy.

## 2015-07-28 NOTE — Telephone Encounter (Signed)
Patient had appointment today to show him how to do insulin.  The pharmacy was out of his medicine and won't have it in until 07/30/15.  Patient rescheduled appointment to 08/01/15.  Patient wants Lugene to call her back about the medication.

## 2015-08-01 ENCOUNTER — Ambulatory Visit (INDEPENDENT_AMBULATORY_CARE_PROVIDER_SITE_OTHER): Payer: BC Managed Care – PPO | Admitting: Family Medicine

## 2015-08-01 ENCOUNTER — Encounter: Payer: Self-pay | Admitting: Family Medicine

## 2015-08-01 VITALS — BP 122/86 | HR 80 | Temp 97.8°F | Wt 207.2 lb

## 2015-08-01 DIAGNOSIS — E119 Type 2 diabetes mellitus without complications: Secondary | ICD-10-CM

## 2015-08-01 MED ORDER — GLUCOSE BLOOD VI STRP: ORAL_STRIP | Status: DC

## 2015-08-01 NOTE — Patient Instructions (Addendum)
Gradually increase your insulin dose.  Add 1 unit each day if your morning sugar is above 150.   6--->7--->8--->9--->10--->11--->12--->13, etc.  If your morning sugar is above 100 but below 150, then continue the same dose as the previous day.  If your morning sugar is below 100, then cut back by 1 unit.  Update me with your sugar and insulin dose in about 1 week, sooner if needed.  Keep working on your weight.  Don't stop the metformin.  Take care.  Glad to see you.

## 2015-08-01 NOTE — Progress Notes (Signed)
Pre visit review using our clinic review tool, if applicable. No additional management support is needed unless otherwise documented below in the visit note.  DM2 insulin use d/w pt.  Sugar have been ~200.  Off januvia.  Still on metformin.   Meds, vitals, and allergies reviewed.   ROS: See HPI.  Otherwise, noncontributory.  nad >15 minutes spent in face to face time with patient, >50% spent in counselling or coordination of care

## 2015-08-02 NOTE — Assessment & Plan Note (Signed)
>  15 minutes spent in face to face time with patient, >50% spent in counselling or coordination of care D/w pt about insulin use, technique.  He was able to demonstrate aseptic injection w/o complication.  At this point, he will: Gradually increase insulin dose.  Add 1 unit each day if your morning sugar is above 150.  6--->7--->8--->9--->10--->11--->12--->13, etc.  If your morning sugar is above 100 but below 150, then continue the same dose as the previous day.  If your morning sugar is below 100, then cut back by 1 unit.  Update me with your sugar and insulin dose in about 1 week, sooner if needed.  Keep working on your weight.  Don't stop the metformin.

## 2015-08-12 ENCOUNTER — Telehealth: Payer: Self-pay | Admitting: Family Medicine

## 2015-08-12 NOTE — Telephone Encounter (Signed)
Great, thanks.  I appreciate his effort.  I'll await the log.

## 2015-08-12 NOTE — Telephone Encounter (Signed)
Pt called to let you know how he was doing since you put him on insulin This morning his blood sugar was 137 Pt has a log and he will drop this off on Monday  Pt stated he has been feeling a lot better He has also cut out all carbs and has lost 10 lbs already Increase water intake to 2 liters daily  He has also been walking on tread mill some

## 2015-08-15 NOTE — Telephone Encounter (Signed)
Pt dropped off insulin log.  Placed in the "prescription tower" for CMA / PCP review. / lt

## 2015-08-16 NOTE — Telephone Encounter (Signed)
Glad his sugars are better.  Would continue as is with the plan.  Add 1 unit each day if your morning sugar is above 150.  If your morning sugar is above 100 but below 150, then continue the same dose as the previous day.  If your morning sugar is below 100, then cut back by 1 unit.  Recheck A1c in about 3 months before an OV.  Update me sooner if needed.  Thanks.

## 2015-08-16 NOTE — Telephone Encounter (Signed)
Patient advised. Appts are already scheduled.

## 2015-09-26 ENCOUNTER — Other Ambulatory Visit: Payer: Self-pay

## 2015-09-26 NOTE — Telephone Encounter (Signed)
Pt said he needs new rx for lantus sent to [facility] [address]. [place] and today FBS 141. Pt said he takes between 25-30 units of insulin daily;started taking 25 units 08/29/15 and pt started increasing when FBS was elevated. Please advise. When pt gets back from beach pt will bring in a log of BS from Feb and March.

## 2015-09-27 ENCOUNTER — Telehealth: Payer: Self-pay | Admitting: *Deleted

## 2015-09-27 MED ORDER — INSULIN GLARGINE 100 UNIT/ML SOLOSTAR PEN: 25.0000 [IU] | PEN_INJECTOR | Freq: Every day | SUBCUTANEOUS | Status: DC

## 2015-09-27 MED ORDER — BASAGLAR KWIKPEN 100 UNIT/ML ~~LOC~~ SOPN: 25.0000 [IU] | PEN_INJECTOR | Freq: Every day | SUBCUTANEOUS | Status: DC

## 2015-09-27 NOTE — Telephone Encounter (Signed)
rx sent.  Thanks.  

## 2015-09-27 NOTE — Telephone Encounter (Signed)
Sent. Thanks.   

## 2015-09-27 NOTE — Addendum Note (Signed)
Addended by: [name] on: 09/27/2015 01:36 PM   Modules accepted: Orders

## 2015-09-27 NOTE — Telephone Encounter (Signed)
Insurance will no longer pay for Lantus Solostar.  Insurance prefers:  [employer], [employer] or [place].

## 2015-10-03 ENCOUNTER — Telehealth: Payer: Self-pay | Admitting: Family Medicine

## 2015-10-03 MED ORDER — BASAGLAR KWIKPEN 100 UNIT/ML ~~LOC~~ SOPN: 25.0000 [IU] | PEN_INJECTOR | Freq: Every day | SUBCUTANEOUS | Status: DC

## 2015-10-03 NOTE — Telephone Encounter (Signed)
Patient called to find out if Dr.Duncan received the blood sugar reading log he dropped off this morning.  Patient said he has 2 days left of insulin.  Patient uses  [employer].  Patient uses [employer].

## 2015-10-03 NOTE — Telephone Encounter (Signed)
He worked up to 30 units and his sugars are generally better w/o lows. Would continue as is.  Sent rx, will see at scheduled appointment.  Thanks.

## 2015-10-04 NOTE — Telephone Encounter (Signed)
Patient advised.

## 2015-10-09 ENCOUNTER — Other Ambulatory Visit: Payer: Self-pay | Admitting: Family Medicine

## 2015-10-09 DIAGNOSIS — E119 Type 2 diabetes mellitus without complications: Secondary | ICD-10-CM

## 2015-10-12 ENCOUNTER — Other Ambulatory Visit: Payer: Self-pay | Admitting: Family Medicine

## 2015-10-13 ENCOUNTER — Other Ambulatory Visit: Payer: Self-pay | Admitting: Family Medicine

## 2015-10-14 ENCOUNTER — Ambulatory Visit (INDEPENDENT_AMBULATORY_CARE_PROVIDER_SITE_OTHER): Payer: BC Managed Care – PPO | Admitting: Family Medicine

## 2015-10-14 ENCOUNTER — Encounter: Payer: Self-pay | Admitting: Family Medicine

## 2015-10-14 ENCOUNTER — Other Ambulatory Visit (INDEPENDENT_AMBULATORY_CARE_PROVIDER_SITE_OTHER): Payer: BC Managed Care – PPO

## 2015-10-14 VITALS — BP 104/60 | HR 72 | Temp 98.4°F | Wt 212.5 lb

## 2015-10-14 DIAGNOSIS — E119 Type 2 diabetes mellitus without complications: Secondary | ICD-10-CM | POA: Diagnosis not present

## 2015-10-14 DIAGNOSIS — L0291 Cutaneous abscess, unspecified: Secondary | ICD-10-CM | POA: Insufficient documentation

## 2015-10-14 DIAGNOSIS — L02211 Cutaneous abscess of abdominal wall: Secondary | ICD-10-CM | POA: Diagnosis not present

## 2015-10-14 LAB — COMPREHENSIVE METABOLIC PANEL
ALK PHOS: 39 U/L (ref 39–117)
ALT: 34 U/L (ref 0–53)
AST: 17 U/L (ref 0–37)
Albumin: 4.3 g/dL (ref 3.5–5.2)
BILIRUBIN TOTAL: 0.4 mg/dL (ref 0.2–1.2)
BUN: 16 mg/dL (ref 6–23)
CO2: 29 mEq/L (ref 19–32)
CREATININE: 0.95 mg/dL (ref 0.40–1.50)
Calcium: 9.2 mg/dL (ref 8.4–10.5)
Chloride: 102 mEq/L (ref 96–112)
GFR: 92.31 mL/min (ref >60.00)
Glucose, Bld: 244 mg/dL — ABNORMAL HIGH (ref 70–99)
POTASSIUM: 4.5 meq/L (ref 3.5–5.1)
Sodium: 138 mEq/L (ref 135–145)
TOTAL PROTEIN: 6.7 g/dL (ref 6.0–8.3)

## 2015-10-14 LAB — LIPID PANEL
CHOLESTEROL: 167 mg/dL (ref 0–200)
HDL: 35.4 mg/dL — ABNORMAL LOW (ref >39.00)
NonHDL: 131.16
Total CHOL/HDL Ratio: 5
Triglycerides: 360 mg/dL — ABNORMAL HIGH (ref 0.0–149.0)
VLDL: 72 mg/dL — ABNORMAL HIGH (ref 0.0–40.0)

## 2015-10-14 LAB — LDL CHOLESTEROL, DIRECT: Direct LDL: 86 mg/dL

## 2015-10-14 LAB — HEMOGLOBIN A1C: HEMOGLOBIN A1C: 7.8 % — AB (ref 4.6–6.5)

## 2015-10-14 NOTE — Assessment & Plan Note (Signed)
Already well on the way to healing.  Keep covered and it should heal up w/o other tx.  D/w pt.   His allergy sx appear to be some better, his exam is unremarkable.

## 2015-10-14 NOTE — Progress Notes (Signed)
Pre visit review using our clinic review tool, if applicable. No additional management support is needed unless otherwise documented below in the visit note.  Spot on the abd.  Noted a few days ago.  It drained on its own.  Was sore and red initially, better now.  No FCNAVD.  He doesn't feel sick.  Minimally sore now but much better than prev.    Some allergy sx recently.  Tried otc cough/cold meds.  Some occ dry cough and some wheeze prev but that is getting better.  Never needed an inhaler.  Using nasal saline.  He is slowly getting better.  Meds, vitals, and allergies reviewed.   ROS: See HPI.  Otherwise, noncontributory.  nad ncat Tm wnl B Nasal and OP exam wnl Neck supple, no LA rrr ctab Lower abd with small resolving pinkish lesion, superficial, 1cm across.  No drainage, not ttp.  Looks to be a healing, prev drained, small site of infection.

## 2015-10-14 NOTE — Patient Instructions (Signed)
I think this is going to heal fine.  It will gradually smooth out.  Keep it covered as needed to keep it from rubbing.  Take care.  Glad to see you.

## 2015-10-20 ENCOUNTER — Ambulatory Visit (INDEPENDENT_AMBULATORY_CARE_PROVIDER_SITE_OTHER): Payer: BC Managed Care – PPO | Admitting: Family Medicine

## 2015-10-20 ENCOUNTER — Encounter: Payer: Self-pay | Admitting: Family Medicine

## 2015-10-20 VITALS — BP 128/86 | HR 94 | Temp 98.6°F | Ht 66.0 in | Wt 209.2 lb

## 2015-10-20 DIAGNOSIS — E782 Mixed hyperlipidemia: Secondary | ICD-10-CM

## 2015-10-20 DIAGNOSIS — Z0000 Encounter for general adult medical examination without abnormal findings: Secondary | ICD-10-CM | POA: Diagnosis not present

## 2015-10-20 DIAGNOSIS — I10 Essential (primary) hypertension: Secondary | ICD-10-CM

## 2015-10-20 DIAGNOSIS — E119 Type 2 diabetes mellitus without complications: Secondary | ICD-10-CM

## 2015-10-20 NOTE — Patient Instructions (Signed)
Don't change your meds for now but keep working on your weight.  Recheck labs before a visit in about 6 months.   Take care.  Glad to see you.

## 2015-10-20 NOTE — Progress Notes (Signed)
Pre visit review using our clinic review tool, if applicable. No additional management support is needed unless otherwise documented below in the visit note.  CPE- See plan.  Routine anticipatory guidance given to patient.  See health maintenance. Tetanus 2010 Flu 2016 PNA 2015 Shingles not due Colon and prostate cancer screening not due Living will d/w pt. wife designated if patient were incapacitated.   D&E d/w pt, encouraged both.  "we're going to start walking next week."  Has diet plans.    abd wall cellulitis resolved since last OV.    Diabetes:  Using medications without difficulties:yes Hypoglycemic episodes: rare sx, mild sx overall, usually if prolonged fasting.   Hyperglycemic episodes: no Feet problems:no Blood Sugars averaging: ~120-140 usually in AM eye exam within last year: yes Has been taking 30 units insulin daily.    He had routine f/u with dermatology pending re: h/o melanoma.    Hypertension:    Using medication without problems or lightheadedness: yes Chest pain with exertion:no Edema:no Short of breath:no Labs d/w pt.   Elevated Cholesterol: Using medications without problems:yes Muscle aches: no Diet compliance:yes Exercise:see above.   PMH and SH reviewed.   Vital signs, Meds and allergies reviewed.  ROS: See HPI.  Otherwise nontributory.   GEN: nad, alert and oriented HEENT: mucous membranes moist NECK: supple w/o LA CV: rrr.  no murmur PULM: ctab, no inc wob ABD: soft, +bs EXT: no edema SKIN: no acute rash  Diabetic foot exam: Normal inspection No skin breakdown No calluses  Normal DP pulses Normal sensation to light tough and monofilament Nails normal

## 2015-10-21 ENCOUNTER — Encounter: Payer: BC Managed Care – PPO | Admitting: Family Medicine

## 2015-10-24 NOTE — Assessment & Plan Note (Signed)
Tetanus 2010 Flu 2016 PNA 2015 Shingles not due Colon and prostate cancer screening not due Living will d/w pt. wife designated if patient were incapacitated.   D&E d/w pt, encouraged both.  "we're going to start walking next week."  Has diet plans.

## 2015-10-24 NOTE — Assessment & Plan Note (Signed)
Needs D&E more than med change.  D/w pt.  Labs d/w pt.   Recheck in about 6 months.

## 2015-10-24 NOTE — Assessment & Plan Note (Signed)
Needs D&E more than med change.  D/w pt.  Labs d/w pt.  He agrees.  Continue statin.

## 2015-10-24 NOTE — Assessment & Plan Note (Signed)
Needs D&E more than med change.  D/w pt.  Labs d/w pt.  He agrees.  Continue current med.

## 2015-11-22 ENCOUNTER — Ambulatory Visit (INDEPENDENT_AMBULATORY_CARE_PROVIDER_SITE_OTHER): Payer: BC Managed Care – PPO | Admitting: Family Medicine

## 2015-11-22 ENCOUNTER — Encounter: Payer: Self-pay | Admitting: Family Medicine

## 2015-11-22 ENCOUNTER — Other Ambulatory Visit: Payer: Self-pay | Admitting: Family Medicine

## 2015-11-22 VITALS — BP 116/82 | HR 82 | Temp 97.4°F | Wt 218.0 lb

## 2015-11-22 DIAGNOSIS — R1010 Upper abdominal pain, unspecified: Secondary | ICD-10-CM

## 2015-11-22 DIAGNOSIS — M79652 Pain in left thigh: Secondary | ICD-10-CM | POA: Diagnosis not present

## 2015-11-22 DIAGNOSIS — M79659 Pain in unspecified thigh: Secondary | ICD-10-CM | POA: Insufficient documentation

## 2015-11-22 DIAGNOSIS — R1011 Right upper quadrant pain: Secondary | ICD-10-CM | POA: Diagnosis not present

## 2015-11-22 LAB — POC URINALSYSI DIPSTICK (AUTOMATED)
Bilirubin, UA: NEGATIVE
Blood, UA: NEGATIVE
Glucose, UA: NEGATIVE
KETONES UA: NEGATIVE
Leukocytes, UA: NEGATIVE
Nitrite, UA: NEGATIVE
PH UA: 6
PROTEIN UA: NEGATIVE
UROBILINOGEN UA: 0.2

## 2015-11-22 LAB — CBC WITH DIFFERENTIAL/PLATELET
BASOS PCT: 0.4 % (ref 0.0–3.0)
Basophils Absolute: 0 10*3/uL (ref 0.0–0.1)
EOS ABS: 0.1 10*3/uL (ref 0.0–0.7)
EOS PCT: 1.6 % (ref 0.0–5.0)
HEMATOCRIT: 43.5 % (ref 39.0–52.0)
HEMOGLOBIN: 14.9 g/dL (ref 13.0–17.0)
LYMPHS PCT: 36.7 % (ref 12.0–46.0)
Lymphs Abs: 2.6 10*3/uL (ref 0.7–4.0)
MCHC: 34.2 g/dL (ref 30.0–36.0)
MCV: 87.4 fl (ref 78.0–100.0)
MONOS PCT: 6.6 % (ref 3.0–12.0)
Monocytes Absolute: 0.5 10*3/uL (ref 0.1–1.0)
NEUTROS ABS: 3.9 10*3/uL (ref 1.4–7.7)
Neutrophils Relative %: 54.7 % (ref 43.0–77.0)
PLATELETS: 258 10*3/uL (ref 150.0–400.0)
RBC: 4.98 Mil/uL (ref 4.22–5.81)
RDW: 13 % (ref 11.5–15.5)
WBC: 7.2 10*3/uL (ref 4.0–10.5)

## 2015-11-22 LAB — COMPREHENSIVE METABOLIC PANEL
ALBUMIN: 4.5 g/dL (ref 3.5–5.2)
ALT: 46 U/L (ref 0–53)
AST: 26 U/L (ref 0–37)
Alkaline Phosphatase: 34 U/L — ABNORMAL LOW (ref 39–117)
BILIRUBIN TOTAL: 0.5 mg/dL (ref 0.2–1.2)
BUN: 15 mg/dL (ref 6–23)
CALCIUM: 9.5 mg/dL (ref 8.4–10.5)
CHLORIDE: 103 meq/L (ref 96–112)
CO2: 26 mEq/L (ref 19–32)
CREATININE: 0.99 mg/dL (ref 0.40–1.50)
GFR: 87.97 mL/min (ref >60.00)
Glucose, Bld: 165 mg/dL — ABNORMAL HIGH (ref 70–99)
Potassium: 4.3 mEq/L (ref 3.5–5.1)
Sodium: 139 mEq/L (ref 135–145)
Total Protein: 6.9 g/dL (ref 6.0–8.3)

## 2015-11-22 LAB — LIPASE: LIPASE: 20 U/L (ref 11.0–59.0)

## 2015-11-22 MED ORDER — METFORMIN HCL 500 MG PO TABS: ORAL_TABLET | ORAL | Status: DC

## 2015-11-22 NOTE — Assessment & Plan Note (Signed)
Likely distal L thigh adductor strain/partial tear, no popeye deformity.  This doesn't appear to be an intrinsic problem in the knee itself. Better with icing, clearly improved per patient.  Continue icing and update me as needed.  Likely imaging wouldn't change mgmt today.  He agrees.

## 2015-11-22 NOTE — Progress Notes (Signed)
Pre visit review using our clinic review tool, if applicable. No additional management support is needed unless otherwise documented below in the visit note.  Sx started about 2 weeks ago.  Fell down some steps and hyperflexed his L knee.  Medial swelling, prev bruised.  No pain with walking but pain is taking a quick step/jogging.  No head injury, no LOC.  No knee joint sx.  No locking or sticking.    Stool changes, some R sided rib pain.  Urine is darker, but drinking 1 L per day.  Sugar has been ~114 recently with insulin 35 units per day.  Diarrhea, up to 4 times a day.  Some R shoulder pain, episodically.  Sx for the last few weeks.  No fevers, no jaundice.  No vomiting.   PMH and SH reviewed  ROS: Per HPI unless specifically indicated in ROS section   Meds, vitals, and allergies reviewed.   GEN: nad, alert and oriented HEENT: mucous membranes moist NECK: supple w/o LA CV: rrr. PULM: ctab, no inc wob ABD: soft, +bs, RUQ not ttp, no rebound, abd not ttp o/w.   EXT: no edema SKIN: no acute rash Medial L thigh with resolving bruise and ttp.  Normal knee ROM w/o locking or clicking.  Patella not ttp

## 2015-11-22 NOTE — Patient Instructions (Addendum)
Cut back on the metformin (down to 2 tabs a day) and increase your insulin if needed. Go to the lab on the way out.  We'll contact you with your lab report. Rosaria Ferries will call about your referral.  See her on the way out.  Avoid fatty foods and take it easy in the meantime.   Keep icing your leg, since that has helped.   Take care.  Glad to see you.  If you have fever, yellow skin, or a lot more abdominal pain, then go to the ER.

## 2015-11-22 NOTE — Assessment & Plan Note (Signed)
Concern for nonemergent GB sx.  D/w pt.  ER cautions given. Avoid fatty foods.  Check labs today and u/s ordered.  We'll go from there.  He agrees.  Possible that metformin is making GI sx worse, he can taper to 2 pills a day and inc insulin as needed.  He agrees.  Still okay for outpatient f/u.  No acute RUQ pain on exam, no jaundice, no fever.

## 2015-11-30 ENCOUNTER — Ambulatory Visit
Admission: RE | Admit: 2015-11-30 | Discharge: 2015-11-30 | Disposition: A | Discharge status: Home / Self Care | Payer: BC Managed Care – PPO | Source: Ambulatory Visit | Attending: Family Medicine | Admitting: Family Medicine

## 2015-11-30 DIAGNOSIS — R1011 Right upper quadrant pain: Secondary | ICD-10-CM

## 2015-12-01 ENCOUNTER — Other Ambulatory Visit: Payer: Self-pay | Admitting: Family Medicine

## 2015-12-01 ENCOUNTER — Telehealth: Payer: Self-pay | Admitting: Family Medicine

## 2015-12-01 DIAGNOSIS — R1011 Right upper quadrant pain: Secondary | ICD-10-CM

## 2015-12-01 NOTE — Telephone Encounter (Signed)
Can you please enter new order for a HIDA Scan?  Correct order is IMG 381 (NM Hepato W/ Eject Fract)// CPT [id number]  Can you please cancel NM Hepatobiliary including GB  Thanks

## 2015-12-01 NOTE — Telephone Encounter (Signed)
Changed.  Thanks.

## 2015-12-05 ENCOUNTER — Telehealth: Payer: Self-pay

## 2015-12-05 NOTE — Telephone Encounter (Signed)
Pt left v/m; pt wants to know if GB attack symptoms could be caused by meds pt is taking; pt wonders if the GB symptoms are a side effect of one or more meds. Pt request cb.

## 2015-12-05 NOTE — Telephone Encounter (Signed)
I didn't mention this, but I had thought about that prev. That was the point about cutting back on the metformin.  If that didn't help, then I doubt the meds are the issue.   Thanks.

## 2015-12-05 NOTE — Telephone Encounter (Signed)
Patient advised.   Patient says the sx have not gotten better since cutting back on the Metformin.  Patient says he also saw that these sx could be connected to the Lisinopril too.

## 2015-12-06 NOTE — Telephone Encounter (Signed)
I've never seen this happen from lisinopril.  That would be really atypical.  I wouldn't stop it yet.

## 2015-12-06 NOTE — Telephone Encounter (Signed)
Left detailed message on voicemail.  

## 2015-12-08 ENCOUNTER — Ambulatory Visit (HOSPITAL_COMMUNITY)
Admission: RE | Admit: 2015-12-08 | Discharge: 2015-12-08 | Disposition: A | Discharge status: Home / Self Care | Payer: BC Managed Care – PPO | Source: Ambulatory Visit | Attending: Family Medicine | Admitting: Family Medicine

## 2015-12-08 DIAGNOSIS — R1011 Right upper quadrant pain: Secondary | ICD-10-CM | POA: Diagnosis not present

## 2015-12-08 MED ORDER — TECHNETIUM TC 99M MEBROFENIN IV KIT: 5.0000 | PACK | Freq: Once | INTRAVENOUS | Status: DC | PRN

## 2015-12-17 ENCOUNTER — Other Ambulatory Visit: Payer: Self-pay | Admitting: Family Medicine

## 2015-12-23 ENCOUNTER — Telehealth: Payer: Self-pay | Admitting: Family Medicine

## 2015-12-23 NOTE — Telephone Encounter (Signed)
Pt dropped off sugar log from dates 11/12/15-12/22/15  In Dr. Damita Dunnings inbox

## 2015-12-23 NOTE — Telephone Encounter (Signed)
I'll check the hard copy when back in clinic.  Thanks.

## 2015-12-27 ENCOUNTER — Telehealth: Payer: Self-pay | Admitting: Family Medicine

## 2015-12-27 NOTE — Telephone Encounter (Signed)
Left detailed message on voicemail.  

## 2015-12-27 NOTE — Telephone Encounter (Signed)
I would continue as is, avoid greasy foods, and we can recheck A1c later this fall.   Thanks.

## 2015-12-27 NOTE — Telephone Encounter (Signed)
Pt returned your call He stated he is no longer having rib pain. If he has rib pain its after he eats something greasy

## 2015-12-27 NOTE — Telephone Encounter (Signed)
Notify pt.  Recent sugar reading better at 50 units a day.  Would continue as is, based on the sugars.   How does he feel?  Let me know.   Thanks.

## 2015-12-27 NOTE — Telephone Encounter (Signed)
Patient advised.

## 2016-02-28 ENCOUNTER — Telehealth: Payer: Self-pay

## 2016-02-28 MED ORDER — BASAGLAR KWIKPEN 100 UNIT/ML ~~LOC~~ SOPN: PEN_INJECTOR | SUBCUTANEOUS | 99 refills | Status: DC

## 2016-02-28 NOTE — Telephone Encounter (Signed)
Pt left v/m; pt said it was discussed with Dr Damita Dunnings at previous appt for pt to adjust his Basaglar and pt was to stop metformin due to rib pain. Pt stopped metformin 11/2015. Pt instructions for Basaglar are to take 25-35 units. Pt is presently taking 50-55 units; pt took 55 units on 02/27/16 and FBS on 02/28/16 was 134. Pt request [email or web address]. Have not changed instructions until Dr Josefine Class approval.

## 2016-02-28 NOTE — Telephone Encounter (Signed)
Sent with sig of "inject up to 70 units per day" to give him room to inc insulin if needed (inc in insulin by 1 unit per day if sugar is high).  Thanks.

## 2016-02-29 NOTE — Telephone Encounter (Signed)
Left message on voice mail for patient.

## 2016-03-23 LAB — HM DIABETES EYE EXAM

## 2016-03-30 ENCOUNTER — Encounter: Payer: Self-pay | Admitting: Family Medicine

## 2016-03-30 ENCOUNTER — Telehealth: Payer: Self-pay | Admitting: Family Medicine

## 2016-03-30 ENCOUNTER — Ambulatory Visit (INDEPENDENT_AMBULATORY_CARE_PROVIDER_SITE_OTHER): Payer: BC Managed Care – PPO | Admitting: Family Medicine

## 2016-03-30 DIAGNOSIS — J0100 Acute maxillary sinusitis, unspecified: Secondary | ICD-10-CM

## 2016-03-30 MED ORDER — AMOXICILLIN-POT CLAVULANATE 875-125 MG PO TABS: 1.0000 | ORAL_TABLET | Freq: Two times a day (BID) | ORAL | 0 refills | Status: DC

## 2016-03-30 MED ORDER — BENZONATATE 200 MG PO CAPS: 200.0000 mg | ORAL_CAPSULE | Freq: Three times a day (TID) | ORAL | 0 refills | Status: DC | PRN

## 2016-03-30 MED ORDER — BASAGLAR KWIKPEN 100 UNIT/ML ~~LOC~~ SOPN: PEN_INJECTOR | SUBCUTANEOUS | 99 refills | Status: DC

## 2016-03-30 NOTE — Telephone Encounter (Signed)
Please call pt about his bill.  He stated he received a $15 bill for his office visit in may.  He only pays a 10 copay

## 2016-03-30 NOTE — Patient Instructions (Signed)
Start augmentin, take tessalon for cough.  Likely sinus infection. Rest and fluids. Take care.  Glad to see you. Update me as needed.

## 2016-03-30 NOTE — Progress Notes (Signed)
Pre visit review using our clinic review tool, if applicable. No additional management support is needed unless otherwise documented below in the visit note. 

## 2016-03-30 NOTE — Progress Notes (Signed)
duration of symptoms: started about 1 week ago rhinorrhea:yes congestion:yes ear pain: feels stuffy B sore throat: yes Cough: yes, chest is sore/burning.  Some clear sputum, not consistent, cough is worse today Myalgias: yes No fevers but felt hot.   No vomiting, no diarrhea.   Sick exposures at work.    Flu shot to be done at work.    Off metformin and abd sx resolved.  Has been working on diet.  Sugar improved recently.  Cut back on insulin in meantime, since sugar has been lower.  Seen at eye clinic recently.  No retinopathy.   Per HPI unless specifically indicated in ROS section   Meds, vitals, and allergies reviewed.   GEN: nad, alert and oriented HEENT: mucous membranes moist, TM w/o erythema, nasal epithelium injected, OP with cobblestoning, max sinuses ttp B NECK: supple w/o LA CV: rrr. PULM: ctab, no inc wob ABD: soft, +bs

## 2016-04-01 DIAGNOSIS — J0100 Acute maxillary sinusitis, unspecified: Secondary | ICD-10-CM | POA: Insufficient documentation

## 2016-04-01 NOTE — Assessment & Plan Note (Signed)
Nontoxic. Start Augmentin. Tessalon when necessary. Rest and fluids. He agrees. He has been working on his diabetes in the meantime. He feels better off metformin.

## 2016-04-04 ENCOUNTER — Telehealth: Payer: Self-pay | Admitting: Family Medicine

## 2016-04-04 ENCOUNTER — Ambulatory Visit (INDEPENDENT_AMBULATORY_CARE_PROVIDER_SITE_OTHER): Payer: BC Managed Care – PPO | Admitting: Family Medicine

## 2016-04-04 ENCOUNTER — Encounter: Payer: Self-pay | Admitting: Family Medicine

## 2016-04-04 VITALS — BP 120/82 | HR 110 | Temp 99.0°F | Wt 208.0 lb

## 2016-04-04 DIAGNOSIS — R059 Cough, unspecified: Secondary | ICD-10-CM

## 2016-04-04 DIAGNOSIS — J069 Acute upper respiratory infection, unspecified: Secondary | ICD-10-CM

## 2016-04-04 DIAGNOSIS — R05 Cough: Secondary | ICD-10-CM | POA: Diagnosis not present

## 2016-04-04 MED ORDER — HYDROCOD POLST-CPM POLST ER 10-8 MG/5ML PO SUER: 5.0000 mL | Freq: Every evening | ORAL | 0 refills | Status: DC | PRN

## 2016-04-04 MED ORDER — AZITHROMYCIN 250 MG PO TABS: ORAL_TABLET | ORAL | 0 refills | Status: DC

## 2016-04-04 NOTE — Telephone Encounter (Signed)
Pt is feeling worse; cough has worsened and pt scheduled appt with Clarene Reamer FNP 04/04/16 at 4:30.

## 2016-04-04 NOTE — Telephone Encounter (Signed)
[facility]    --------------------------------------------------------------------------------   Patient Name: Gordon Hudson  Gender: Male  DOB: [birth year]   Age: 40 Y 29 M 10 D  Return Phone Number: [phone] (Primary)  Address:     City/State/Zip:  [place]     Client [facility] Client  Client Site [facility]  Physician Renford Dills - MD  Contact Type Call  Who Is Calling Patient / Member / Family / Caregiver  Call Type Triage / Clinical  Relationship To Patient Self  Return Phone Number [phone] (Primary)  Chief Complaint Cough  Reason for Call Symptomatic / Request for [place] states has a sinus infection, has a cough, cough meds are not helping, taking Ammox for sinus infection  Appointment Disposition EMR Caller Not Reached  Info pasted into Epic Yes  Translation No       Nurse Assessment       Guidelines          Guideline Title Affirmed Question Affirmed Notes Nurse Date/Time (Eastern Time)       Disp. Time Eilene Ghazi Time) Disposition Final User    04/04/2016 12:22:23 PM Attempt made - no message left   Venetia Maxon, RN, Manuela Schwartz      04/04/2016 1:27:12 PM FINAL ATTEMPT MADE - no message left Yes Venetia Maxon, RN, Manuela Schwartz

## 2016-04-04 NOTE — Patient Instructions (Signed)

## 2016-04-04 NOTE — Progress Notes (Signed)
Subjective:    Patient ID: Gordon Hudson, male    DOB: 09-27-1975, 40 y.o.   MRN: [id number]  HPI This is a pleasant 40 yo male who was seen 03/30/16 with cough, congestion and treated with Augmentin. Patient has been taking as prescribed and presents today with continued cough, subjective fever, fatigue. Cough productive of white, thick sputum with few flecks of dark brown. No improvement with tessalon perles.  Feels sore in chest with cough. Using nasal spray daily. Hearing wheezes intermittently. Decreased energy. Nasal symptoms slightly improved. No sore throat, no ear pain. Tried to go to work today, but was sent home because his boss thought he looked "so bad." Never smoked. No history of asthma. Has seasonal allergies.   Past Medical History:  Diagnosis Date  . Allergy    Spring and Fall  . Diabetes mellitus 07/16/2004   Type II  . Hyperlipidemia   . Hypertension   . Melanoma ([place]) 2014  . Seizure disorder [facility])    Age 89 but no subsequent seizures, no meds since before 1st grade   Past Surgical History:  Procedure Laterality Date  . Repair of ruptured Achilles tendon  04/1999   Dr. Holland Commons  . TYMPANOSTOMY TUBE PLACEMENT  Age 89  . VASECTOMY  2014   Family History  Problem Relation Age of Onset  . Diabetes Mother   . Hyperlipidemia Mother   . Hypertension Mother   . Hyperlipidemia Father   . Glaucoma Father   . Hypertension Father   . Colon cancer Neg Hx   . Prostate cancer Neg Hx    Social History  Substance Use Topics  . Smoking status: Never Smoker  . Smokeless tobacco: Never Used  . Alcohol use 0.0 oz/week     Comment: occasionally      Review of Systems Per HPI    Objective:   Physical Exam  Constitutional: He is oriented to person, place, and time. He appears well-developed and well-nourished. He appears ill.  HENT:  Head: Normocephalic and atraumatic.  Right Ear: External ear normal.  Left Ear: External ear normal.  Nose: Nose normal.    Mouth/Throat: Oropharynx is clear and moist. No oropharyngeal exudate.  Eyes: Conjunctivae are normal.  Neck: Normal range of motion. Neck supple.  Cardiovascular: Normal rate, regular rhythm and normal heart sounds.   Pulmonary/Chest: Effort normal and breath sounds normal. No respiratory distress. He has no wheezes. He has no rales.  Lymphadenopathy:    He has no cervical adenopathy.  Neurological: He is alert and oriented to person, place, and time.  Skin: Skin is warm. He is diaphoretic (mildly).  Psychiatric: He has a normal mood and affect. His behavior is normal. Judgment and thought content normal.  Vitals reviewed.     BP 120/82   Pulse (!) 110   Temp 99 F (37.2 C)   Wt 208 lb (94.3 kg)   SpO2 97%   BMI 33.57 kg/m  Wt Readings from Last 3 Encounters:  04/04/16 208 lb (94.3 kg)  03/30/16 217 lb 8 oz (98.7 kg)  11/22/15 218 lb (98.9 kg)       Assessment & Plan:  1. Acute upper respiratory infection - has not had improvement on augmentin, will switch for atypical coverage - RTC precautions reviewed - increase fluids, symptomatic relief measures reviewed- OTC analgesics, decongestants - azithromycin (ZITHROMAX) 250 MG tablet; Take 2 tablets today and then 1 tablet daily until gone  Dispense: 6 tablet; Refill: 0  2.  Cough - chlorpheniramine-HYDROcodone (TUSSIONEX PENNKINETIC ER) 10-8 MG/5ML SUER; Take 5 mLs by mouth at bedtime as needed for cough.  Dispense: 70 mL; Refill: 0   Clarene Reamer, FNP-BC  [facility]  04/05/2016 11:50 AM

## 2016-04-17 NOTE — Telephone Encounter (Signed)
Left mssg for pt to return call. Have sent the claim for this DOS to be reviewed and re-processed.

## 2016-04-22 ENCOUNTER — Other Ambulatory Visit: Payer: Self-pay | Admitting: Family Medicine

## 2016-04-22 DIAGNOSIS — E119 Type 2 diabetes mellitus without complications: Secondary | ICD-10-CM

## 2016-04-23 ENCOUNTER — Other Ambulatory Visit (INDEPENDENT_AMBULATORY_CARE_PROVIDER_SITE_OTHER): Payer: BC Managed Care – PPO

## 2016-04-23 DIAGNOSIS — E119 Type 2 diabetes mellitus without complications: Secondary | ICD-10-CM | POA: Diagnosis not present

## 2016-04-23 LAB — HEMOGLOBIN A1C: HEMOGLOBIN A1C: 6.6 % — AB (ref 4.6–6.5)

## 2016-04-27 ENCOUNTER — Ambulatory Visit (INDEPENDENT_AMBULATORY_CARE_PROVIDER_SITE_OTHER): Payer: BC Managed Care – PPO | Admitting: Family Medicine

## 2016-04-27 ENCOUNTER — Encounter: Payer: Self-pay | Admitting: Family Medicine

## 2016-04-27 ENCOUNTER — Telehealth: Payer: Self-pay

## 2016-04-27 DIAGNOSIS — E119 Type 2 diabetes mellitus without complications: Secondary | ICD-10-CM | POA: Diagnosis not present

## 2016-04-27 MED ORDER — ONETOUCH ULTRA SYSTEM W/DEVICE KIT: PACK | 0 refills | Status: DC

## 2016-04-27 MED ORDER — BASAGLAR KWIKPEN 100 UNIT/ML ~~LOC~~ SOPN: PEN_INJECTOR | SUBCUTANEOUS | 99 refills | Status: DC

## 2016-04-27 NOTE — Patient Instructions (Addendum)
Thanks for your effort.  Keep tapering the insulin dose if needed.  . Take care.  Glad to see you.  Recheck in about 6 months with labs before a physical.   Update me as needed.   If you are getting lightheaded, then stop the lisinopril and update me.

## 2016-04-27 NOTE — Progress Notes (Signed)
Pre visit review using our clinic review tool, if applicable. No additional management support is needed unless otherwise documented below in the visit note. 

## 2016-04-27 NOTE — Progress Notes (Signed)
Diabetes:  Using medications without difficulties:yes Hypoglycemic episodes:no Hyperglycemic episodes:not now, with diet and weight loss.   Feet problems:no Blood Sugars averaging: 100-120 in the AM usually eye exam within last year: yes He has to inc his insulin up to 60 units to get sugar controlled in the last few months, but then was able to taper down when he worked more on his diet.  D/w pt.  A1c 6.6.    Meds, vitals, and allergies reviewed.   ROS: Per HPI unless specifically indicated in ROS section   GEN: nad, alert and oriented HEENT: mucous membranes moist NECK: supple w/o LA CV: rrr. PULM: ctab, no inc wob ABD: soft, +bs EXT: no edema SKIN: no acute rash  Diabetic foot exam: Normal inspection No skin breakdown No calluses  Normal DP pulses Normal sensation to light touch and monofilament Nails normal

## 2016-04-27 NOTE — Telephone Encounter (Signed)
Pt was seen earlier today and forgot to get a new diabetic meter; pt still has test strips and request one touch ultra meter to [facility] [place]; pt has had present meter over 5 years. Done as requested.

## 2016-04-29 NOTE — Assessment & Plan Note (Signed)
Clearly improved with diet and exercise and weight loss. He can continue to taper his insulin if sugars continue to decrease. If he gets any lightheadedness with weight loss, then he can taper his blood pressure medicines and update me. See after visit summary. I thanked him for his effort. Recheck in about 6 months. He agrees.

## 2016-05-11 ENCOUNTER — Encounter: Payer: Self-pay | Admitting: Family Medicine

## 2016-05-11 ENCOUNTER — Ambulatory Visit (INDEPENDENT_AMBULATORY_CARE_PROVIDER_SITE_OTHER): Payer: BC Managed Care – PPO | Admitting: Family Medicine

## 2016-05-11 VITALS — BP 118/78 | HR 78 | Temp 98.5°F | Wt 210.5 lb

## 2016-05-11 DIAGNOSIS — R059 Cough, unspecified: Secondary | ICD-10-CM

## 2016-05-11 DIAGNOSIS — J0100 Acute maxillary sinusitis, unspecified: Secondary | ICD-10-CM | POA: Diagnosis not present

## 2016-05-11 DIAGNOSIS — J069 Acute upper respiratory infection, unspecified: Secondary | ICD-10-CM | POA: Diagnosis not present

## 2016-05-11 DIAGNOSIS — R05 Cough: Secondary | ICD-10-CM | POA: Diagnosis not present

## 2016-05-11 MED ORDER — HYDROCOD POLST-CPM POLST ER 10-8 MG/5ML PO SUER: 5.0000 mL | Freq: Every evening | ORAL | 0 refills | Status: DC | PRN

## 2016-05-11 MED ORDER — LISINOPRIL 10 MG PO TABS: 5.0000 mg | ORAL_TABLET | Freq: Every day | ORAL | Status: DC

## 2016-05-11 MED ORDER — AZITHROMYCIN 250 MG PO TABS: ORAL_TABLET | ORAL | 0 refills | Status: DC

## 2016-05-11 NOTE — Progress Notes (Signed)
He has been occ lightheaded.  See AVS.   He got sick about 5 days ago.  Voice is hoarse.  Stuffy nose, runny nose.  HA.  Felt warm, unclear if he had a fever.  Cough, some sputum.  Facial pain.  Ear feel stuffy.  Some popping in the R ear.    Meds, vitals, and allergies reviewed.   ROS: Per HPI unless specifically indicated in ROS section   GEN: nad, alert and oriented HEENT: mucous membranes moist, tm w/o erythema, nasal exam w/o erythema, clear discharge noted,  OP with cobblestoning, L max sinus ttp NECK: supple w/o LA CV: rrr.   PULM: ctab, no inc wob EXT: no edema SKIN: no acute rash

## 2016-05-11 NOTE — Patient Instructions (Addendum)
Cut the lisinopril back to 5mg  a day.   Start the antibiotics and use the cough medicine in the meantime.   Sedation caution on the cough medicine.  Take care.  Glad to see you.

## 2016-05-11 NOTE — Progress Notes (Signed)
Pre visit review using our clinic review tool, if applicable. No additional management support is needed unless otherwise documented below in the visit note. 

## 2016-05-13 NOTE — Assessment & Plan Note (Signed)
Nontoxic. Okay for outpatient follow-up. Azithromycin. Prescription for cough syrup given to patient. Routine cautions given. Follow-up when necessary. Supportive care otherwise. He has been slightly lightheaded. Reasonable to cut lisinopril and have him update me as needed.

## 2016-07-10 ENCOUNTER — Other Ambulatory Visit: Payer: Self-pay

## 2016-07-10 MED ORDER — GLUCOSE BLOOD VI STRP: ORAL_STRIP | 1 refills | Status: DC

## 2016-07-10 NOTE — Telephone Encounter (Signed)
Pt request refill test strip to [facility] [address]. Pt seen 04/2016 . Refill done per protocol and pt voiced understanding.

## 2016-10-14 ENCOUNTER — Other Ambulatory Visit: Payer: Self-pay | Admitting: Family Medicine

## 2016-10-14 DIAGNOSIS — E119 Type 2 diabetes mellitus without complications: Secondary | ICD-10-CM

## 2016-10-19 ENCOUNTER — Other Ambulatory Visit (INDEPENDENT_AMBULATORY_CARE_PROVIDER_SITE_OTHER): Payer: BC Managed Care – PPO

## 2016-10-19 DIAGNOSIS — E119 Type 2 diabetes mellitus without complications: Secondary | ICD-10-CM | POA: Diagnosis not present

## 2016-10-19 LAB — LIPID PANEL
CHOL/HDL RATIO: 5
Cholesterol: 208 mg/dL — ABNORMAL HIGH (ref 0–200)
HDL: 38.2 mg/dL — ABNORMAL LOW (ref >39.00)
NONHDL: 169.92
TRIGLYCERIDES: 386 mg/dL — AB (ref 0.0–149.0)
VLDL: 77.2 mg/dL — AB (ref 0.0–40.0)

## 2016-10-19 LAB — COMPREHENSIVE METABOLIC PANEL
ALT: 31 U/L (ref 0–53)
AST: 20 U/L (ref 0–37)
Albumin: 4.2 g/dL (ref 3.5–5.2)
Alkaline Phosphatase: 40 U/L (ref 39–117)
BUN: 16 mg/dL (ref 6–23)
CALCIUM: 9.4 mg/dL (ref 8.4–10.5)
CHLORIDE: 105 meq/L (ref 96–112)
CO2: 29 meq/L (ref 19–32)
Creatinine, Ser: 1.08 mg/dL (ref 0.40–1.50)
GFR: 79.23 mL/min (ref >60.00)
GLUCOSE: 153 mg/dL — AB (ref 70–99)
POTASSIUM: 4.4 meq/L (ref 3.5–5.1)
Sodium: 139 mEq/L (ref 135–145)
Total Bilirubin: 0.5 mg/dL (ref 0.2–1.2)
Total Protein: 7 g/dL (ref 6.0–8.3)

## 2016-10-19 LAB — HEMOGLOBIN A1C: HEMOGLOBIN A1C: 7.9 % — AB (ref 4.6–6.5)

## 2016-10-19 LAB — LDL CHOLESTEROL, DIRECT: Direct LDL: 94 mg/dL

## 2016-10-23 ENCOUNTER — Encounter: Payer: Self-pay | Admitting: Family Medicine

## 2016-10-23 ENCOUNTER — Ambulatory Visit (INDEPENDENT_AMBULATORY_CARE_PROVIDER_SITE_OTHER): Payer: BC Managed Care – PPO | Admitting: Family Medicine

## 2016-10-23 VITALS — BP 122/78 | HR 78 | Temp 97.7°F | Ht 66.0 in | Wt 219.8 lb

## 2016-10-23 DIAGNOSIS — K760 Fatty (change of) liver, not elsewhere classified: Secondary | ICD-10-CM

## 2016-10-23 DIAGNOSIS — I10 Essential (primary) hypertension: Secondary | ICD-10-CM

## 2016-10-23 DIAGNOSIS — Z0000 Encounter for general adult medical examination without abnormal findings: Secondary | ICD-10-CM

## 2016-10-23 DIAGNOSIS — R12 Heartburn: Secondary | ICD-10-CM

## 2016-10-23 DIAGNOSIS — R0683 Snoring: Secondary | ICD-10-CM

## 2016-10-23 DIAGNOSIS — C439 Malignant melanoma of skin, unspecified: Secondary | ICD-10-CM

## 2016-10-23 DIAGNOSIS — E119 Type 2 diabetes mellitus without complications: Secondary | ICD-10-CM

## 2016-10-23 DIAGNOSIS — E782 Mixed hyperlipidemia: Secondary | ICD-10-CM

## 2016-10-23 MED ORDER — LISINOPRIL 10 MG PO TABS: 10.0000 mg | ORAL_TABLET | Freq: Every day | ORAL | 3 refills | Status: DC

## 2016-10-23 MED ORDER — BASAGLAR KWIKPEN 100 UNIT/ML ~~LOC~~ SOPN: PEN_INJECTOR | SUBCUTANEOUS | 99 refills | Status: DC

## 2016-10-23 MED ORDER — SIMVASTATIN 40 MG PO TABS: 40.0000 mg | ORAL_TABLET | Freq: Every day | ORAL | 3 refills | Status: DC

## 2016-10-23 NOTE — Patient Instructions (Signed)
Ask your wife if you have troubles from stopping breathing from snoring.  If so, then let me know.  Sugar and snoring and cholesterol all would likely improve with weight loss.  Recheck labs in about 3 months before a visit.   Take care.  Glad to see you.

## 2016-10-23 NOTE — Progress Notes (Signed)
CPE- See plan.  Routine anticipatory guidance given to patient.  See health maintenance.  The possibility exists that previously documented standard health maintenance information may have been brought forward from a previous encounter into this note.  If needed, that same information has been updated to reflect the current situation based on today's encounter.    Tetanus 2010 Flu 2017 at work.  PNA 2015 Shingles not due Colon and prostate cancer screening not due Living will d/w pt. Wife designated if patient were incapacitated.   D&E d/w pt, encouraged both. He is getting ready to get back to diet and exercise.  Weight noted, d/w pt.    He was promoted at work, d/w pt.  He is happy about that.    Diabetes:  Using medications without difficulties:yes Hypoglycemic episodes: no Hyperglycemic episodes: no Feet problems:no Blood Sugars averaging: ~140-150, occ higher, usually in AM.  Prev better with lower weight, d/w pt.   eye exam within last year: yes Has been taking 50 units insulin daily.    He had routine f/u with dermatology pending re: h/o melanoma.  d/w pt.  No new lesions.    Hypertension:               Using medication without problems or lightheadedness: yes Chest pain with exertion:no Edema:no Short of breath:no Labs d/w pt.  Taking lisinopril 10mg  a day.    Elevated Cholesterol: Using medications without problems:yes Muscle aches: no Diet compliance:see above Exercise:see above.   GERD controlled with QOD PPI.  No ADE on med.  Noted relief with med.    He is noted snoring more with inc in weight.  No known apnea.  See AVS.    PMH and SH reviewed  Meds, vitals, and allergies reviewed.   ROS: Per HPI.  Unless specifically indicated otherwise in HPI, the patient denies:  General: fever. Eyes: acute vision changes ENT: sore throat Cardiovascular: chest pain Respiratory: SOB GI: vomiting GU: dysuria Musculoskeletal: acute back pain Derm: acute  rash Neuro: acute motor dysfunction Psych: worsening mood Endocrine: polydipsia Heme: bleeding Allergy: hayfever  GEN: nad, alert and oriented HEENT: mucous membranes moist NECK: supple w/o LA CV: rrr. PULM: ctab, no inc wob ABD: soft, +bs EXT: no edema SKIN: no acute rash  Diabetic foot exam: Normal inspection No skin breakdown No calluses  Normal DP pulses Normal sensation to light touch and monofilament Nails normal

## 2016-10-24 DIAGNOSIS — R0683 Snoring: Secondary | ICD-10-CM | POA: Insufficient documentation

## 2016-10-24 NOTE — Assessment & Plan Note (Signed)
History of. Discussed with patient about diet and weight loss.

## 2016-10-24 NOTE — Assessment & Plan Note (Signed)
Continue statin for now. No adverse effect on medication. Needs weight loss. Discussed with patient. He agrees.

## 2016-10-24 NOTE — Assessment & Plan Note (Signed)
Tetanus 2010 Flu 2017 at work.  PNA 2015 Shingles not due Colon and prostate cancer screening not due Living will d/w pt. Wife designated if patient were incapacitated.   D&E d/w pt, encouraged both. He is getting ready to get back to diet and exercise.  Weight noted, d/w pt.

## 2016-10-24 NOTE — Assessment & Plan Note (Signed)
Reasonable control. Continue work on diet and exercise. Labs discussed with patient.

## 2016-10-24 NOTE — Assessment & Plan Note (Signed)
A1c up. Discussed with patient. He's been work more on diet and exercise. Recheck labs in 3 months. Update me as needed in the meantime. He agrees.

## 2016-10-24 NOTE — Assessment & Plan Note (Signed)
Needs weight loss. He will check with his wife to see if there is any noted apnea. Update me as needed. Okay for outpatient follow-up. He agrees.

## 2016-10-24 NOTE — Assessment & Plan Note (Signed)
Needs weight loss. Taking PPI every other day. Able to tolerate. Update me as needed.

## 2016-10-24 NOTE — Assessment & Plan Note (Signed)
Per dermatology. He has had routine follow-up.

## 2016-12-05 ENCOUNTER — Ambulatory Visit (INDEPENDENT_AMBULATORY_CARE_PROVIDER_SITE_OTHER)
Admission: RE | Admit: 2016-12-05 | Discharge: 2016-12-05 | Disposition: A | Discharge status: Home / Self Care | Payer: BC Managed Care – PPO | Source: Ambulatory Visit | Attending: Family Medicine | Admitting: Family Medicine

## 2016-12-05 ENCOUNTER — Encounter: Payer: Self-pay | Admitting: Family Medicine

## 2016-12-05 ENCOUNTER — Ambulatory Visit (INDEPENDENT_AMBULATORY_CARE_PROVIDER_SITE_OTHER): Payer: BC Managed Care – PPO | Admitting: Family Medicine

## 2016-12-05 VITALS — BP 124/86 | HR 76 | Temp 97.7°F | Wt 224.5 lb

## 2016-12-05 DIAGNOSIS — M25511 Pain in right shoulder: Secondary | ICD-10-CM

## 2016-12-05 MED ORDER — MELOXICAM 15 MG PO TABS
15.0000 mg | ORAL_TABLET | Freq: Every day | ORAL | 0 refills | Status: DC
Start: 2016-12-05 — End: 2017-01-01

## 2016-12-05 NOTE — Patient Instructions (Signed)
Stop ibuprofen, change to meloxicam daily with food.  Use the shoulder exercises.  Xray on the way out, we'll update you about that.  If not better, then call about seeing Dr. Lorelei Pont here.  Take care.  Glad to see you.

## 2016-12-05 NOTE — Progress Notes (Signed)
R shoulder pain with overhead/throwing motion.  Started to get worse about 2 weeks ago.  Using ice and ibuprofen.  Had been loading and splitting wood.   No L sided sx.  Grip is still normal.   About 4-5 years ago, he had fallen and hit his R shoulder.  He had eval at that point w/o clear injury noted.  He did well in the meantime.    He had return of heartburn when taking ibuprofen.  Prev was doing better.    Sugar ~120, occ lower recently.    Meds, vitals, and allergies reviewed.   ROS: Per HPI unless specifically indicated in ROS section   nad ncat rrr ctab Neck supple, normal ROM R shoulder with pain on internal and external rotation. No arm drop. Normal grip. Positive impingement. Pain with supraspinatus testing. Some improvement in pain and range of motion with scapular manipulation.

## 2016-12-06 DIAGNOSIS — M25511 Pain in right shoulder: Secondary | ICD-10-CM | POA: Insufficient documentation

## 2016-12-06 DIAGNOSIS — M25519 Pain in unspecified shoulder: Secondary | ICD-10-CM | POA: Insufficient documentation

## 2016-12-06 NOTE — Assessment & Plan Note (Signed)
Stop ibuprofen. Change to meloxicam. Check plain films. Anatomy discussed with patient. Likely rotator cuff irritation. Discussed with patient about home exercise program. Update [place] if not improved. I would like him to see Dr. Lorelei Pont if he does not improve. >25 minutes spent in face to face time with patient, >50% spent in counselling or coordination of care.

## 2017-01-01 ENCOUNTER — Ambulatory Visit (INDEPENDENT_AMBULATORY_CARE_PROVIDER_SITE_OTHER): Payer: BC Managed Care – PPO | Admitting: Family Medicine

## 2017-01-01 ENCOUNTER — Encounter: Payer: Self-pay | Admitting: Family Medicine

## 2017-01-01 VITALS — BP 110/80 | HR 74 | Temp 97.9°F | Ht 66.0 in | Wt 223.0 lb

## 2017-01-01 DIAGNOSIS — M7541 Impingement syndrome of right shoulder: Secondary | ICD-10-CM | POA: Diagnosis not present

## 2017-01-01 DIAGNOSIS — M7581 Other shoulder lesions, right shoulder: Secondary | ICD-10-CM

## 2017-01-01 MED ORDER — METHYLPREDNISOLONE ACETATE 40 MG/ML IJ SUSP
80.0000 mg | Freq: Once | INTRAMUSCULAR | Status: AC
  Administered 2017-01-01: 80 mg via INTRA_ARTICULAR

## 2017-01-01 MED ORDER — MELOXICAM 15 MG PO TABS: 15.0000 mg | ORAL_TABLET | Freq: Every day | ORAL | 1 refills | Status: DC

## 2017-01-01 NOTE — Progress Notes (Signed)
Dr. Frederico Hamman T. Elfreida Heggs, MD, [facility] [place], [address] Phone: [phone] Fax: [phone]  01/01/2017  Patient: Gordon Hudson, MRN: [id number], DOB: 12/08/1975, 41 y.o.  Primary Physician:  [name], MD   Chief Complaint  Patient presents with  . Shoulder Pain    Right Shoulder Pain for over a month. Saw Dr Damita Dunnings recently for it.   Subjective:   This 41 y.o. male patient noted above presents with shoulder pain that has been ongoing for approx 7 weeks. 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 The patient denies neck pain or radicular symptoms. Denies dislocation, subluxation, separation of the shoulder. The patient does complain of pain in the overhead plane with significant painful arc of motion and IROM.  Medications Tried: Tylenol, NSAIDS Tried PT: No, active with home exercise program, which has helped.  Prior shoulder Injury: No Prior surgery: No Prior fracture: No  The PMH, PSH, Social History, Family History, Medications, and allergies have been reviewed in [facility], and have been updated if relevant.  Patient Active Problem List   Diagnosis Date Noted  . Right shoulder pain 12/06/2016  . Snoring 10/24/2016  . Thigh pain 11/22/2015  . History of vasectomy 05/20/2015  . Advance care planning 10/17/2014  . Routine general medical examination at a health care facility 09/27/2013  . Melanoma of skin ([place]) 09/20/2013  . Fatty liver 08/07/2011  . RUQ discomfort 07/31/2011  . HYPERTENSION, BENIGN 07/24/2010  . HEARTBURN 07/24/2010  . ALLERGIC RHINITIS 04/25/2010  . 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 SEIZURE DISORDER, HX OF 11/15/2006  . Diabetes mellitus without complication ([place]) 35/68/6168    Past Medical History:  Diagnosis Date  . Allergy    Spring and Fall  . Diabetes mellitus 07/16/2004   Type II  .  Hyperlipidemia   . Hypertension   . Melanoma ([place]) 2014  . Seizure disorder [facility])    Age 15 but no subsequent seizures, no meds since before 1st grade    Past Surgical History:  Procedure Laterality Date  . Repair of ruptured Achilles tendon  04/1999   Dr. Holland Commons  . TYMPANOSTOMY TUBE PLACEMENT  Age 15  . VASECTOMY  2014    Social History   Social History  . Marital status: Married    Spouse name: N/A  . Number of children: 2  . Years of education: N/A   Occupational History  . [place] History Main Topics  . Smoking status: Never Smoker  . Smokeless tobacco: Never Used  . Alcohol use 0.0 oz/week     Comment: occasionally  . Drug use: No  . Sexual activity: Not on file   Other Topics Concern  . 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 Diabetes Mother   . Hyperlipidemia Mother   . Hypertension Mother   . Hyperlipidemia Father   . Hypertension Father   . Colon cancer Neg Hx   . Prostate cancer Neg Hx     Allergies  Allergen Reactions  . Glimepiride Other (See Comments)    headache  . Metformin And Related     intolerant  Medication list reviewed and updated in full in [place].  GEN: No fevers, chills. Nontoxic. Primarily MSK c/o today. MSK: Detailed in the HPI GI: tolerating PO intake without difficulty Neuro: No numbness, parasthesias, or tingling associated. Otherwise the pertinent positives of the ROS are noted above.   Objective:   Blood pressure 110/80, pulse 74, temperature 97.9 F (36.6 C), temperature source Oral, height '5\' 6"'  (1.676 m), weight 223 lb (101.2 kg), SpO2 94 %.  GEN: Well-developed,well-nourished,in no acute distress; alert,appropriate and cooperative throughout examination HEENT: Normocephalic and atraumatic  without obvious abnormalities. Ears, externally no deformities PULM: Breathing comfortably in no respiratory distress EXT: No clubbing, cyanosis, or edema PSYCH: Normally interactive. Cooperative during the interview. Pleasant. Friendly and conversant. Not anxious or depressed appearing. Normal, full affect.  Shoulder: R Inspection: No muscle wasting or winging Ecchymosis/edema: neg  AC joint, scapula, clavicle: NT Cervical spine: NT, full ROM Spurling's: neg Abduction: full, 5/5 Flexion: full, 5/5 IR, full, lift-off: 5/5 ER at neutral: full, 5/5 AC crossover: neg Neer: pos, mild Hawkins: pos Drop Test: neg Empty Can: pos Supraspinatus insertion: mild-mod T Bicipital groove: NT Speed's: neg Yergason's: neg Sulcus sign: neg Scapular dyskinesis: none C5-T1 intact  Neuro: Sensation intact Grip 5/5   Radiology: Dg Shoulder Right  Result Date: 12/06/2016 CLINICAL DATA:  Right shoulder pain after heavy lifting 2 weeks ago EXAM: RIGHT SHOULDER - 2+ VIEW COMPARISON:  None. FINDINGS: There is no evidence of fracture or dislocation. There is no evidence of arthropathy or other focal bone abnormality. Soft tissues are unremarkable. IMPRESSION: Negative. Electronically Signed   By: Rolm Baptise M.D.   On: 12/06/2016 08:37    Independently reviewed. Grossly unremarkable. No significant arthropathy. Electronically Signed  By: [name], MD On: 01/01/2017 1:45 PM   Assessment and Plan:    Subacromial impingement of right shoulder  Rotator cuff tendonitis, right - Plan: methylPREDNISolone acetate (DEPO-MEDROL) injection 80 mg  He is made good progress with home rehabilitation alone. He is still having some pain, but improving with rehabilitation and meloxicam.  Classic subacromial and subcoracoid impingement. We are going to do as subacromial injection taper by him some pain relief.  Given work ethic, muscularity, and compliance with HEP, I think he will do really well.   If  not improved in 6 weeks, recommended f/u with me again.  I appreciate the opportunity to evaluate this very friendly patient. If you have any question regarding her care or prognosis, do not hesitate to ask.   SubAC Injection, R Verbal consent was obtained from the patient. Risks (including rare infection), benefits, and alternatives were explained. Patient prepped with Chloraprep and Ethyl Chloride used for anesthesia. The subacromial space was injected using the posterior approach. The patient tolerated the procedure well and had decreased pain post injection. No complications. Injection: 8 cc of Lidocaine 1% and 2 mL of Depo-Medrol 40 mg. Needle: 22 gauge   Modified Medications   Modified Medication Previous Medication   MELOXICAM (MOBIC) 15 MG TABLET meloxicam (MOBIC) 15 MG tablet      Take 1 tablet (15 mg total) by mouth daily.    Take 1 tablet (15 mg total) by mouth daily.    Signed,  Maud Deed. Kenzee Bassin, MD   Patient's Medications  New Prescriptions   No medications on file  Previous Medications   BLOOD GLUCOSE MONITORING SUPPL (ONE TOUCH ULTRA SYSTEM KIT) W/DEVICE KIT    Check blood sugar three times a day and as directed. Dx E11.9;  insulin treated.   FISH OIL-OMEGA-3 FATTY ACIDS 1000 MG CAPSULE    Take 1 g by mouth 4 (four) times daily.     GARLIC 882 MG TABS    Take 1 tablet by mouth daily.     GLUCOSE BLOOD (ONE TOUCH ULTRA TEST) TEST STRIP    Test blood sugar three times daily as instructed by physician.  Dx: E11.9, insulin treated   INSULIN GLARGINE (BASAGLAR KWIKPEN) 100 UNIT/ML SOPN    Inject 50 units daily   INSULIN PEN NEEDLE 31G X 5 MM MISC    Use daily with insulin pen   LANCETS (ONETOUCH ULTRASOFT) LANCETS    Test blood sugar twice daily as instructed by physician.  Dx: 250.00   LISINOPRIL (PRINIVIL,ZESTRIL) 10 MG TABLET    Take 1 tablet (10 mg total) by mouth daily.   LORATADINE (ALAVERT) 10 MG TABLET    Take 10 mg by mouth daily.     MULTIPLE VITAMIN  (MULTIVITAMIN) TABLET    Take 1 tablet by mouth daily.     OMEPRAZOLE (PRILOSEC OTC) 20 MG TABLET    Take 20 mg by mouth daily.   SIMVASTATIN (ZOCOR) 40 MG TABLET    Take 1 tablet (40 mg total) by mouth at bedtime.  Modified Medications   Modified Medication Previous Medication   MELOXICAM (MOBIC) 15 MG TABLET meloxicam (MOBIC) 15 MG tablet      Take 1 tablet (15 mg total) by mouth daily.    Take 1 tablet (15 mg total) by mouth daily.  Discontinued Medications   No medications on file

## 2017-01-07 ENCOUNTER — Telehealth: Payer: Self-pay

## 2017-01-07 ENCOUNTER — Other Ambulatory Visit: Payer: Self-pay | Admitting: Family Medicine

## 2017-01-07 MED ORDER — TRIAMCINOLONE ACETONIDE 0.1 % EX CREA: 1.0000 "application " | TOPICAL_CREAM | Freq: Two times a day (BID) | CUTANEOUS | 0 refills | Status: DC

## 2017-01-07 NOTE — Telephone Encounter (Signed)
I spoke with the patient. He is not having any redness or warmth at all. He is not having any pain in her shoulder. Motion is normal.  He is having some topical bumps like acne that are itchy on the surface only. He is taking Claritin already.  I recommended that he take Benadryl 2 tablets at night in addition of this. Also send him in some triamcinolone cream.

## 2017-01-07 NOTE — Telephone Encounter (Signed)
Pt was seen 01/01/17 and was given cortisone injection; on 01/04/17 pt developed rash and itching in area where cortisone injection was given. No difficulty breathing. Today pt has a patch of rash the size of a tennis ball and is still itching. Pt has not taken any med such as benadryl for the itching until gets instructions from Dr Lorelei Pont. Pt request [address] [facility] [address]

## 2017-01-07 NOTE — Telephone Encounter (Signed)
I need to speak to this patient. Can you call him and please pull me out of the room to discuss his symptoms with him.   I called and got no answer.

## 2017-01-21 ENCOUNTER — Other Ambulatory Visit: Payer: Self-pay | Admitting: Family Medicine

## 2017-01-21 DIAGNOSIS — E119 Type 2 diabetes mellitus without complications: Secondary | ICD-10-CM

## 2017-01-22 ENCOUNTER — Other Ambulatory Visit (INDEPENDENT_AMBULATORY_CARE_PROVIDER_SITE_OTHER): Payer: BC Managed Care – PPO

## 2017-01-22 DIAGNOSIS — E119 Type 2 diabetes mellitus without complications: Secondary | ICD-10-CM

## 2017-01-22 LAB — HEMOGLOBIN A1C: HEMOGLOBIN A1C: 8.6 % — AB (ref 4.6–6.5)

## 2017-01-29 ENCOUNTER — Encounter: Payer: Self-pay | Admitting: Family Medicine

## 2017-01-29 ENCOUNTER — Ambulatory Visit (INDEPENDENT_AMBULATORY_CARE_PROVIDER_SITE_OTHER): Payer: BC Managed Care – PPO | Admitting: Family Medicine

## 2017-01-29 DIAGNOSIS — M25511 Pain in right shoulder: Secondary | ICD-10-CM | POA: Diagnosis not present

## 2017-01-29 DIAGNOSIS — E119 Type 2 diabetes mellitus without complications: Secondary | ICD-10-CM | POA: Diagnosis not present

## 2017-01-29 NOTE — Patient Instructions (Addendum)
If AM sugar above 150, then give an extra unit per day.  If AM sugar 100-150, then no change If AM sugar <100, the take away a unit. Recheck in about 3 months, labs ahead of time.  Take care.  Glad to see you.  Update me as needed.

## 2017-01-29 NOTE — Progress Notes (Signed)
Shoulder pain per Dr. Lorelei Pont.  Some better initially but regressed in the meantime.  Now not much better in the meantime, still with some pain.  Some pain with reaching back last night.  Prev injected, but he had a local rash at the site, about 3 days later.  Used TAC and that helped.  No h/o similar rash in the past.  He still can't throw a ball and that is the main issue for him, with his daughter playing softball.    Diabetes:  Using medications without difficulties: yes Hypoglycemic episodes: see below, but no sx Hyperglycemic episodes: see below Feet problems:no Blood Sugars averaging: variable, 174 this AM.  138 yesterday AM.  occ lower than 100 in the AMs.   eye exam within last year:yes A1c up, d/w pt.   Sugar elevation noted after injection, up to 285 after injection.   We when was drinking more water his sugar was clearly improved.   He wasn't as active due to his shoulder pain and he was off his diet.    Meds, vitals, and allergies reviewed.   ROS: Per HPI unless specifically indicated in ROS section   GEN: nad, alert and oriented HEENT: mucous membranes moist NECK: supple w/o LA CV: rrr. PULM: ctab, no inc wob ABD: soft, +bs EXT: no edema SKIN: no acute rash  Some pain with ext and int rotation of R shoulder.

## 2017-01-30 NOTE — Assessment & Plan Note (Signed)
His shoulder rash is resolved. He had significant sugar elevation after previous steroid injection. He still has significant shoulder pain at this point and is not able to be as active as he would like. I will ask for input from Dr. Lorelei Pont, with appreciation.

## 2017-01-30 NOTE — Assessment & Plan Note (Signed)
Diet his clearly been off, activity has been decreased. He did have elevation related to previous steroid injection. Discussed with patient about diet and exercise and he will work on both. No change in medications at this point other than daily adjustment of his long-acting insulin, see after visit summary. He agrees. Recheck in about 3 months.

## 2017-02-18 ENCOUNTER — Ambulatory Visit: Payer: BC Managed Care – PPO | Admitting: Family Medicine

## 2017-03-04 ENCOUNTER — Encounter: Payer: Self-pay | Admitting: Family Medicine

## 2017-03-04 ENCOUNTER — Ambulatory Visit (INDEPENDENT_AMBULATORY_CARE_PROVIDER_SITE_OTHER): Payer: BC Managed Care – PPO | Admitting: Family Medicine

## 2017-03-04 VITALS — BP 104/76 | HR 68 | Temp 97.5°F | Ht 66.0 in | Wt 222.2 lb

## 2017-03-04 DIAGNOSIS — M7501 Adhesive capsulitis of right shoulder: Secondary | ICD-10-CM | POA: Diagnosis not present

## 2017-03-04 DIAGNOSIS — E10618 Type 1 diabetes mellitus with other diabetic arthropathy: Secondary | ICD-10-CM

## 2017-03-04 DIAGNOSIS — M25511 Pain in right shoulder: Secondary | ICD-10-CM

## 2017-03-04 NOTE — Progress Notes (Signed)
Dr. Frederico Hamman T. Joelynn Dust, MD, [facility] [place], [address] Phone: [phone] Fax: [phone]  03/04/2017  Patient: Gordon Hudson, MRN: [id number], DOB: [birth year], 41 y.o.  Primary Physician:  [name], MD   Chief Complaint  Patient presents with  . Follow-up    Right Shoulder   Subjective:   Gordon Hudson is a 41 y.o. very pleasant male patient who presents with the following:  F/u R shoulder, s/p subac injection 2 mo ago - had been making some improvement at that time. At that point the patient had full motion and was primarily having some impingement.  Now, the patient is having some decreased range of motion in all directions and is having a global ache of the shoulder.  He is having pain with certain movements, particularly abduction and internal range of motion.  He also has pain at nighttime, and shoulder wake him up.  His strength remains preserved throughout his shoulder.  He does have poorly controlled diabetes that is insulin-dependent.  Lab Results  Component Value Date   HGBA1C 8.6 (H) 01/22/2017      Past Medical History, Surgical History, Social History, Family History, Problem List, Medications, and Allergies have been reviewed and updated if relevant.  Patient Active Problem List   Diagnosis Date Noted  . Right shoulder pain 12/06/2016  . Snoring 10/24/2016  . Thigh pain 11/22/2015  . History of vasectomy 05/20/2015  . Advance care planning 10/17/2014  . Routine general medical examination at a health care facility 09/27/2013  . Melanoma of skin ([place]) 09/20/2013  . Fatty liver 08/07/2011  . RUQ discomfort 07/31/2011  . HYPERTENSION, BENIGN 07/24/2010  . HEARTBURN 07/24/2010  . ALLERGIC RHINITIS 04/25/2010  . 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 SEIZURE DISORDER, HX OF 11/15/2006  . Diabetes mellitus without complication ([place]) 27/01/8674    Past Medical History:  Diagnosis  Date  . Allergy    Spring and Fall  . Diabetes mellitus 07/16/2004   Type II  . Hyperlipidemia   . Hypertension   . Melanoma ([place]) 2014  . Seizure disorder [facility])    Age 61 but no subsequent seizures, no meds since before 1st grade    Past Surgical History:  Procedure Laterality Date  . Repair of ruptured Achilles tendon  04/1999   Dr. Holland Commons  . TYMPANOSTOMY TUBE PLACEMENT  Age 61  . VASECTOMY  2014    Social History   Social History  . Marital status: Married    Spouse name: N/A  . Number of children: 2  . Years of education: N/A   Occupational History  . [place] History Main Topics  . Smoking status: Never Smoker  . Smokeless tobacco: Never Used  . Alcohol use 0.0 oz/week     Comment: occasionally  . Drug use: No  . Sexual activity: Not on file   Other Topics Concern  . 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 Diabetes Mother   . Hyperlipidemia Mother   . Hypertension Mother   . Hyperlipidemia Father   . Hypertension Father   . Colon cancer Neg Hx   . Prostate cancer Neg Hx     Allergies  Allergen Reactions  .  Glimepiride Other (See Comments)    headache  . Metformin And Related     intolerant    Medication list reviewed and updated in full in [place].  GEN: No fevers, chills. Nontoxic. Primarily MSK c/o today. MSK: Detailed in the HPI GI: tolerating PO intake without difficulty Neuro: No numbness, parasthesias, or tingling associated. Otherwise the pertinent positives of the ROS are noted above.   Objective:   BP 104/76   Pulse 68   Temp (!) 97.5 F (36.4 C) (Oral)   Ht '5\' 6"'  (1.676 m)   Wt 222 lb 4 oz (100.8 kg)   BMI 35.87 kg/m    GEN: WDWN, NAD, Non-toxic, Alert & Oriented x 3 HEENT: Atraumatic, Normocephalic.    Ears and Nose: No external deformity. EXTR: No clubbing/cyanosis/edema NEURO: Normal gait.  PSYCH: Normally interactive. Conversant. Not depressed or anxious appearing.  Calm demeanor.   Shoulder: R and L Inspection: No muscle wasting or winging Ecchymosis/edema: neg  AC joint, scapula, clavicle: NT Cervical spine: NT, full ROM Spurling's: neg ABNORMAL SIDE TESTED: R UNLESS OTHERWISE NOTED, THE CONTRALATERAL SIDE HAS FULL RANGE OF MOTION. Abduction: 5/5, LIMITED TO 155 DEGREES Flexion: 5/5, LIMITED TO 165 DEGNO ROM  IR, lift-off: 5/5. TESTED AT 90 DEGREES OF ABDUCTION, LIMITED TO 10 DEGREES ER at neutral:  5/5, TESTED AT 90 DEGREES OF ABDUCTION, LIMITED TO 80 DEGREES AC crossover and compression: PAIN Drop Test: neg Empty Can: neg Supraspinatus insertion: NT Bicipital groove: NT ALL OTHER SPECIAL TESTING EQUIVOCAL GIVEN LOSS OF MOTION C5-T1 intact Sensation intact Grip 5/5    Radiology: Dg Shoulder Right  Result Date: 12/06/2016 CLINICAL DATA:  Right shoulder pain after heavy lifting 2 weeks ago EXAM: RIGHT SHOULDER - 2+ VIEW COMPARISON:  None. FINDINGS: There is no evidence of fracture or dislocation. There is no evidence of arthropathy or other focal bone abnormality. Soft tissues are unremarkable. IMPRESSION: Negative. Electronically Signed   By: Rolm Baptise M.D.   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  This may be secondary to pain phenomenon after initial impingement and lack of movement. Avg length of symptoms 12 months.  Diabetics have a 10-20% lifelong risk of development of frozen shoulder.  Patient was given a systematic ROM protocol from [employer] to be done daily. Emphasized adherence and  recommended formal PT.  Tylenol or NSAID of choice prn for pain relief  Intraarticular corticosteroid injections in Adhesive Capsulitis may be reasonable if DM stable at f/u and no better / worse.   Follow-up: Return in about 2 months (around 05/04/2017).  Future Appointments Date Time Provider [place]  04/26/2017 7:45 AM [facility]  05/03/2017 8:30 AM [name], MD [facility]  05/06/2017 8:00 AM Gina Leblond, Frederico Hamman, MD [facility]   Medications Discontinued During This Encounter  Medication Reason  . meloxicam (MOBIC) 15 MG tablet Ineffective   Orders Placed This Encounter  Procedures  . Ambulatory referral to Physical Therapy    Signed,  Frederico Hamman T. Derrall Hicks, MD   Allergies as of 03/04/2017      Reactions   Glimepiride Other (See Comments)   headache   Metformin And Related    intolerant      Medication List  Accurate as of 03/04/17 11:59 PM. Always use your most recent med list.          ALAVERT 10 MG tablet Generic drug:  loratadine Take 10 mg by mouth daily.   BASAGLAR KWIKPEN 100 UNIT/ML Sopn Inject 50 units daily   fish oil-omega-3 fatty acids 1000 MG capsule Take 1 g by mouth 4 (four) times daily.   Garlic 212 MG Tabs Take 1 tablet by mouth daily.   glucose blood test strip Commonly known as:  ONE TOUCH ULTRA TEST Test blood sugar three times daily as instructed by physician.  Dx: E11.9, insulin treated   Insulin Pen Needle 31G X 5 MM Misc Use daily with insulin pen   lisinopril 10 MG tablet Commonly known as:  PRINIVIL,ZESTRIL Take 1 tablet (10 mg total) by mouth daily.   multivitamin tablet Take 1 tablet by mouth daily.   omeprazole 20 MG tablet Commonly known as:  PRILOSEC OTC Take 20 mg by mouth daily.   ONE TOUCH ULTRA SYSTEM KIT w/Device Kit Check blood sugar three times a day and as directed. Dx E11.9; insulin treated.   onetouch ultrasoft lancets Test blood sugar  twice daily as instructed by physician.  Dx: 250.00   simvastatin 40 MG tablet Commonly known as:  ZOCOR Take 1 tablet (40 mg total) by mouth at bedtime.   triamcinolone cream 0.1 % Commonly known as:  KENALOG Apply 1 application topically 2 (two) times daily.

## 2017-03-05 ENCOUNTER — Telehealth: Payer: Self-pay

## 2017-03-05 NOTE — Telephone Encounter (Signed)
Pt called to report his BG has been elevated... Pt was on vacation last week... Pt reports Fri night he forgot his PM insulin... BG fasting 191 Sat... Sun 179 and Mon 151... 2-3 hours after a meal 223-- usually 120-150... Pt wants to know what he is supposed to do if he were to miss an insulin dose again... Please advise

## 2017-03-06 NOTE — Telephone Encounter (Signed)
Patient notified as instructed by telephone and verbalized understanding. 

## 2017-03-06 NOTE — Telephone Encounter (Signed)
If he realizes that he has missed his insulin dose, and it is only 1 or 2 hours after the fact, then it would be okay to give his usual dose of insulin. This specifically applies to his baseline dose of daily insulin. If it's been more than a few hours, he should just skip the dose entirely, not give any makeup insulin, and then restart his next dose the next day. Thanks.

## 2017-04-21 ENCOUNTER — Other Ambulatory Visit: Payer: Self-pay | Admitting: Family Medicine

## 2017-04-21 DIAGNOSIS — E119 Type 2 diabetes mellitus without complications: Secondary | ICD-10-CM

## 2017-04-26 ENCOUNTER — Other Ambulatory Visit: Payer: BC Managed Care – PPO

## 2017-04-29 ENCOUNTER — Telehealth: Payer: Self-pay

## 2017-04-29 ENCOUNTER — Other Ambulatory Visit (INDEPENDENT_AMBULATORY_CARE_PROVIDER_SITE_OTHER): Payer: BC Managed Care – PPO

## 2017-04-29 DIAGNOSIS — E119 Type 2 diabetes mellitus without complications: Secondary | ICD-10-CM | POA: Diagnosis not present

## 2017-04-29 LAB — HEMOGLOBIN A1C: HEMOGLOBIN A1C: 8 % — AB (ref 4.6–6.5)

## 2017-04-29 MED ORDER — AZITHROMYCIN 250 MG PO TABS: ORAL_TABLET | ORAL | 0 refills | Status: DC

## 2017-04-29 NOTE — Telephone Encounter (Signed)
Pt was in office today for labs; has f/u appt with Dr Damita Dunnings on 05/02/17. On 04/24/17 felt some congestion but took flu shot and on 04/25/17 started with S/T and fever. Now no S/T or fever but has drainage at back of throat with hoarseness, ears are stopped up but no earache and no difficulty hearing. Prod cough with white phlegm; no SOB or wheezing. Pt presently taking sinus and congestion advil and using nasal spray. Pt said in past Z pack works well for pt. Proofreader [address] and pt request cb after Dr Damita Dunnings reviews note.

## 2017-04-29 NOTE — Telephone Encounter (Signed)
Sent rx, but give this 1-2 more days.  If getting worse in the meantime, then start the rx. Keep the f/u appointment.  Thanks.

## 2017-04-29 NOTE — Telephone Encounter (Signed)
Patient advised.

## 2017-05-02 ENCOUNTER — Encounter: Payer: Self-pay | Admitting: Family Medicine

## 2017-05-02 ENCOUNTER — Ambulatory Visit (INDEPENDENT_AMBULATORY_CARE_PROVIDER_SITE_OTHER): Payer: BC Managed Care – PPO | Admitting: Family Medicine

## 2017-05-02 ENCOUNTER — Other Ambulatory Visit: Payer: Self-pay | Admitting: Family Medicine

## 2017-05-02 VITALS — BP 136/98 | HR 71 | Temp 97.8°F | Ht 66.0 in | Wt 220.4 lb

## 2017-05-02 DIAGNOSIS — E119 Type 2 diabetes mellitus without complications: Secondary | ICD-10-CM | POA: Diagnosis not present

## 2017-05-02 MED ORDER — BASAGLAR KWIKPEN 100 UNIT/ML ~~LOC~~ SOPN: PEN_INJECTOR | SUBCUTANEOUS | Status: DC

## 2017-05-02 NOTE — Patient Instructions (Signed)
Keep working on diet, recheck A1c in about 3 months prior to a visit and update me as needed.  Take care.  Glad to see you.  Thanks for your effort.

## 2017-05-02 NOTE — Progress Notes (Signed)
Diabetes:  Using medications without difficulties: yes, usually 55-60 units of insulin daily.   Hypoglycemic episodes:no- lowest reading was 99 Hyperglycemic episodes:no Feet problems:no Blood Sugars averaging: ~150 recently.   eye exam within last year: yes A1c some better but not to goal, d/w pt.  He has been working more on diet in the meantime.    He feels better in the meantime with less ear congestion and didn't have to take zithromax.  D/w pt.  No fevers.    Flu shot prev done.    His R shoulder is some better, still sore some of the time but not as bad as prev.    His mother has surgery planned for tomorrow.  His father continues to have issues with low heart rate, needing mult f/u appointments.  This is stressful, d/w pt.  This may explain some of his BP elevation.    PMH and SH reviewed  Meds, vitals, and allergies reviewed.   ROS: Per HPI unless specifically indicated in ROS section   GEN: nad, alert and oriented HEENT: mucous membranes moist, TM wnl, nasal exam slightly stuffy, OP wnl, sinuses not ttp NECK: supple w/o LA CV: rrr. PULM: ctab, no inc wob ABD: soft, +bs EXT: no edema SKIN: no acute rash  Diabetic foot exam: Normal inspection No skin breakdown No calluses except for L1st toe callus noted.  Normal DP pulses Normal sensation to light touch and monofilament Nails normal

## 2017-05-03 ENCOUNTER — Ambulatory Visit: Payer: BC Managed Care – PPO | Admitting: Family Medicine

## 2017-05-03 NOTE — Assessment & Plan Note (Signed)
A1c some better but not to goal, d/w pt.  He has been working more on diet in the meantime.   He can likely make further improvement in his A1c with continued work on weight with diet and exercise. No change in meds at this point. He agrees. See after visit summary.

## 2017-05-03 NOTE — Telephone Encounter (Signed)
On 4.10.18 #90+3 was faxed/thx dmf

## 2017-05-06 ENCOUNTER — Ambulatory Visit: Payer: BC Managed Care – PPO | Admitting: Family Medicine

## 2017-05-06 ENCOUNTER — Ambulatory Visit (INDEPENDENT_AMBULATORY_CARE_PROVIDER_SITE_OTHER): Payer: BC Managed Care – PPO | Admitting: Family Medicine

## 2017-05-06 ENCOUNTER — Other Ambulatory Visit: Payer: Self-pay | Admitting: Family Medicine

## 2017-05-06 VITALS — BP 114/86 | HR 92 | Temp 98.2°F | Ht 66.0 in | Wt 219.0 lb

## 2017-05-06 DIAGNOSIS — M7501 Adhesive capsulitis of right shoulder: Secondary | ICD-10-CM | POA: Diagnosis not present

## 2017-05-06 DIAGNOSIS — E10618 Type 1 diabetes mellitus with other diabetic arthropathy: Secondary | ICD-10-CM | POA: Diagnosis not present

## 2017-05-06 MED ORDER — GLUCOSE BLOOD VI STRP: 1.0000 | ORAL_STRIP | 3 refills | Status: DC | PRN

## 2017-05-06 NOTE — Progress Notes (Signed)
Dr. Frederico Hamman T. Amy Belloso, MD, [facility] Sports Medicine [place], [address] Phone: [phone] Fax: [phone]  05/06/2017  Patient: Gordon Hudson, MRN: [id number], DOB: Jun 06, 1976, 41 y.o.  Primary Physician:  [name], MD   Chief Complaint  Patient presents with  . Follow-up    right shoulder   Subjective:   CHAUNCE Hudson is a 41 y.o. very pleasant male patient who presents with the following:  He is doing quite well and is been very compliant with his treatment of his frozen shoulder.  His range of motion is almost entirely improved with the exception of internal range of motion.  Past Medical History, Surgical History, Social History, Family History, Problem List, Medications, and Allergies have been reviewed and updated if relevant.  Patient Active Problem List   Diagnosis Date Noted  . Right shoulder pain 12/06/2016  . Snoring 10/24/2016  . Thigh pain 11/22/2015  . History of vasectomy 05/20/2015  . Advance care planning 10/17/2014  . Routine general medical examination at a health care facility 09/27/2013  . Melanoma of skin ([place]) 09/20/2013  . Fatty liver 08/07/2011  . RUQ discomfort 07/31/2011  . HYPERTENSION, BENIGN 07/24/2010  . HEARTBURN 07/24/2010  . ALLERGIC RHINITIS 04/25/2010  . 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 SEIZURE DISORDER, HX OF 11/15/2006  . Diabetes mellitus without complication ([place]) 16/38/4665    Past Medical History:  Diagnosis Date  . Allergy    Spring and Fall  . Diabetes mellitus 07/16/2004   Type II  . Hyperlipidemia   . Hypertension   . Melanoma ([place]) 2014  . Seizure disorder [facility])    Age 41 but no subsequent seizures, no meds since before 1st grade    Past Surgical History:  Procedure Laterality Date  . Repair of ruptured Achilles tendon  04/1999   Dr. Holland Commons  . TYMPANOSTOMY TUBE PLACEMENT  Age 41  . VASECTOMY  2014    Social History   Social History  .  Marital status: Married    Spouse name: N/A  . Number of children: 2  . Years of education: N/A   Occupational History  . [place] History Main Topics  . Smoking status: Never Smoker  . Smokeless tobacco: Never Used  . Alcohol use 0.0 oz/week     Comment: occasionally  . Drug use: No  . Sexual activity: Not on file   Other Topics Concern  . 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 Diabetes Mother   . Hyperlipidemia Mother   . Hypertension Mother   . Hyperlipidemia Father   . Hypertension Father   . Colon cancer Neg Hx   . Prostate cancer Neg Hx     Allergies  Allergen Reactions  . Glimepiride Other (See Comments)    headache  . Metformin And Related     intolerant    Medication list reviewed and updated in full in [place].   GEN: No acute illnesses, no fevers, chills. GI: No n/v/d, eating normally Pulm: No SOB Interactive and getting along well at home.  Otherwise, ROS is as per the HPI.  Objective:   BP 114/86   Pulse 92   Temp 98.2 F (36.8 C) (Oral)  Ht 5' 6" (1.676 m)   Wt 219 lb (99.3 kg)   BMI 35.35 kg/m   GEN: WDWN, NAD, Non-toxic, A & O x 3 HEENT: Atraumatic, Normocephalic. Neck supple. No masses, No LAD. Ears and Nose: No external deformity. EXTR: No c/c/e NEURO Normal gait.  PSYCH: Normally interactive. Conversant. Not depressed or anxious appearing.  Calm demeanor.   Right shoulder: Strength remains 5/5 in all directions. Abduction and flexion are full.  External rotation is equal to the contralateral side.  There is approximately a 40 lack of internal range of motion on the right side compared to the left when the shoulder is in 90 of abduction.  Laboratory and Imaging Data:  Assessment and Plan:   Adhesive  capsulitis of right shoulder associated with type 1 diabetes mellitus ([facility])  He should continue to do better.  Discharged to home care.  Internal range of motion always is the last 2 improve during the thawing phase.  Follow-up: No Follow-up on file.  Future Appointments Date Time Provider Department Center  08/02/2017 8:15 AM [facility]  08/05/2017 9:30 AM Duncan, Graham S, MD [facility]    Meds ordered this encounter  Medications  . glucose blood test strip    Sig: 1 each by Other route as needed for other. Use as instructed    Dispense:  100 each    Refill:  3    Please consider 90 day supplies to promote better adherence   Medications Discontinued During This Encounter  Medication Reason  . glucose blood test strip Reorder   No orders of the defined types were placed in this encounter.   Signed,  Spencer T. Copland, MD   Allergies as of 05/06/2017      Reactions   Glimepiride Other (See Comments)   headache   Metformin And Related    intolerant      Medication List       Accurate as of 05/06/17 11:59 PM. Always use your most recent med list.          ALAVERT 10 MG tablet Generic drug:  loratadine Take 10 mg by mouth daily.   BASAGLAR KWIKPEN 100 UNIT/ML Sopn Inject 55-60 units daily   fish oil-omega-3 fatty acids 1000 MG capsule Take 1 g by mouth 4 (four) times daily.   Garlic 100 MG Tabs Take 1 tablet by mouth daily.   glucose blood test strip 1 each by Other route as needed for other. Use as instructed   Insulin Pen Needle 31G X 5 MM Misc Use daily with insulin pen   lisinopril 10 MG tablet Commonly known as:  PRINIVIL,ZESTRIL Take 1 tablet (10 mg total) by mouth daily.   multivitamin tablet Take 1 tablet by mouth daily.   omeprazole 20 MG tablet Commonly known as:  PRILOSEC OTC Take 20 mg by mouth daily.   ONE TOUCH ULTRA SYSTEM KIT w/Device Kit Check blood sugar three times a day and as directed. Dx E11.9; insulin  treated.   onetouch ultrasoft lancets Test blood sugar twice daily as instructed by physician.  Dx: 250.00   simvastatin 40 MG tablet Commonly known as:  ZOCOR Take 1 tablet (40 mg total) by mouth at bedtime.   triamcinolone cream 0.1 % Commonly known as:  KENALOG Apply 1 application topically 2 (two) times daily.       

## 2017-05-07 ENCOUNTER — Encounter: Payer: Self-pay | Admitting: Family Medicine

## 2017-05-08 ENCOUNTER — Other Ambulatory Visit: Payer: Self-pay

## 2017-05-08 MED ORDER — LISINOPRIL 10 MG PO TABS: 10.0000 mg | ORAL_TABLET | Freq: Every day | ORAL | 3 refills | Status: DC

## 2017-06-08 ENCOUNTER — Other Ambulatory Visit: Payer: Self-pay | Admitting: Family Medicine

## 2017-06-11 ENCOUNTER — Other Ambulatory Visit: Payer: Self-pay | Admitting: Family Medicine

## 2017-06-14 ENCOUNTER — Other Ambulatory Visit: Payer: Self-pay | Admitting: *Deleted

## 2017-06-14 MED ORDER — LISINOPRIL 10 MG PO TABS: 10.0000 mg | ORAL_TABLET | Freq: Every day | ORAL | 3 refills | Status: DC

## 2017-07-29 ENCOUNTER — Other Ambulatory Visit: Payer: Self-pay | Admitting: Family Medicine

## 2017-07-29 DIAGNOSIS — E119 Type 2 diabetes mellitus without complications: Secondary | ICD-10-CM

## 2017-08-02 ENCOUNTER — Other Ambulatory Visit (INDEPENDENT_AMBULATORY_CARE_PROVIDER_SITE_OTHER): Payer: BC Managed Care – PPO

## 2017-08-02 ENCOUNTER — Ambulatory Visit: Payer: BC Managed Care – PPO | Admitting: Family Medicine

## 2017-08-02 DIAGNOSIS — E119 Type 2 diabetes mellitus without complications: Secondary | ICD-10-CM | POA: Diagnosis not present

## 2017-08-02 LAB — HEMOGLOBIN A1C: HEMOGLOBIN A1C: 9.4 % — AB (ref 4.6–6.5)

## 2017-08-05 ENCOUNTER — Encounter: Payer: Self-pay | Admitting: Family Medicine

## 2017-08-05 ENCOUNTER — Telehealth: Payer: Self-pay | Admitting: Family Medicine

## 2017-08-05 ENCOUNTER — Ambulatory Visit: Payer: BC Managed Care – PPO | Admitting: Family Medicine

## 2017-08-05 VITALS — BP 120/78 | HR 86 | Temp 98.5°F | Wt 223.5 lb

## 2017-08-05 DIAGNOSIS — Z85820 Personal history of malignant melanoma of skin: Secondary | ICD-10-CM

## 2017-08-05 DIAGNOSIS — C439 Malignant melanoma of skin, unspecified: Secondary | ICD-10-CM

## 2017-08-05 DIAGNOSIS — E119 Type 2 diabetes mellitus without complications: Secondary | ICD-10-CM

## 2017-08-05 MED ORDER — SAXAGLIPTIN HCL 5 MG PO TABS: 2.5000 mg | ORAL_TABLET | Freq: Every day | ORAL | 5 refills | Status: DC

## 2017-08-05 MED ORDER — SITAGLIPTIN PHOSPHATE 100 MG PO TABS: 50.0000 mg | ORAL_TABLET | Freq: Every day | ORAL | 5 refills | Status: DC

## 2017-08-05 MED ORDER — GLUCOSE BLOOD VI STRP: ORAL_STRIP | 3 refills | Status: DC

## 2017-08-05 NOTE — Telephone Encounter (Signed)
Pt asking if a cheaper medication other than Januvia could be prescribed due to the medication costing $47/month.

## 2017-08-05 NOTE — Telephone Encounter (Signed)
Copied from [place] [phone] [phone]. Topic: Inquiry >> Aug 05, 2017 11:15 AM Patrice Paradise wrote: Reason for CRM: Patient called to see if Dr. Damita Dunnings can prescribe something cheaper than the sitaGLIPtin (JANUVIA) 100 MG tablet. The med would cost him $47 a mth.

## 2017-08-05 NOTE — Patient Instructions (Addendum)
Rosaria Ferries will call about your referral. Price check Celesta Gentile and start that in the meantime.   Take care.  Glad to see you.  Recheck in about 3-4 month with labs prior to a physical.   Work on diet and exercise in the meantime.

## 2017-08-05 NOTE — Progress Notes (Signed)
Diabetes:  Using medications without difficulties: yes, taking 60 units of insulin a day Hypoglycemic episodes:no Hyperglycemic episodes:no Feet problems: no Blood Sugars averaging: can be <150 or up to 180s in the AM eye exam within last year: yes A1c up, d/w pt.   Intolerant of mult meds, d/w pt.   Diet and exercise d/w pt. "I could be doing better."  Goal weight loss of 2 lbs per month.  He isn't drinking as much water recently.  He usually does better with his weight with inc in water intake.   Eat right diet d/w pt.  Handout given to patient.    Shoulder pain.  Some dec in pain.  He can throw now and that is better.  Some pain sleeping on R side but better than prev.  Still working on ROM for the shoulder at home.  S/p PT.    H/o melanoma and wanted to get referral to Dr. [place] Crane in [place].    Meds, vitals, and allergies reviewed.   ROS: Per HPI unless specifically indicated in ROS section   GEN: nad, alert and oriented HEENT: mucous membranes moist NECK: supple w/o LA CV: rrr. PULM: ctab, no inc wob ABD: soft, +bs EXT: no edema Skin with irritation lesion inferior to L orbit- he'll f/u with derm about this lesions.  Scaly lesion on the L anterior shin. he'll f/u with derm about this lesions.  No lesion at prev melanoma excision site.

## 2017-08-05 NOTE — Telephone Encounter (Signed)
Have him price check onglyza.  I sent that.  Don't mix with [place], med list updated.  Thanks.

## 2017-08-05 NOTE — Telephone Encounter (Signed)
Patient advised.

## 2017-08-06 ENCOUNTER — Telehealth: Payer: Self-pay | Admitting: *Deleted

## 2017-08-06 NOTE — Telephone Encounter (Signed)
PA received for Onglyza and indicates that Januvia is preferred. Dr. Damita Dunnings advised to send this back to him and don't do the PA for Onglyza.

## 2017-08-06 NOTE — Assessment & Plan Note (Signed)
H/o, refer to derm to continue care in [place].  He agrees.  The lesions described above don't look typical for melanoma but I want him to get eval done.  He agrees.

## 2017-08-06 NOTE — Assessment & Plan Note (Signed)
A1c up, d/w pt.   Intolerant of mult meds, d/w pt.   Diet and exercise d/w pt. "I could be doing better."  Goal weight loss of 2 lbs per month.  He isn't drinking as much water recently.  He usually does better with his weight with inc in water intake.   Eat right diet d/w pt.  Handout given to patient.   He can price check onglyza since [place] was too expensive.  May be able to start 2.5mg  and inc to 5mg  onglyza, with potential need to dec his insulin, d/w pt.  Recheck in about 3-4 months.  He agrees.

## 2017-08-07 MED ORDER — SITAGLIPTIN PHOSPHATE 100 MG PO TABS
50.0000 mg | ORAL_TABLET | Freq: Every day | ORAL | 5 refills | Status: DC
Start: 2017-08-07 — End: 2017-11-08

## 2017-08-07 NOTE — Telephone Encounter (Signed)
I updated the med list.  The PA isn't going to apply or get approved since he hasn't tried [place] yet.   rx resent for [place].   That is likely the best option.  It isn't cheap.  Cutting the pills in half may help him with cost and it may be enough to bring his sugar down some.  I would try that with continued work on diet and exercise.  Thanks.

## 2017-08-07 NOTE — Addendum Note (Signed)
Addended by: [name] on: 08/07/2017 01:24 PM   Modules accepted: Orders

## 2017-08-07 NOTE — Telephone Encounter (Signed)
Agreed, thanks. Can taper insulin as needed.

## 2017-08-07 NOTE — Telephone Encounter (Signed)
Patient notified as instructed by telephone and verbalized understanding. Patient stated that he got the [place] filled and has been taking it for 2 days. Patient stated that his blood sugar this morning was down to 106. Patient stated that he has cut his insulin back to 50 units. Advised patient if he has any further problems to let Dr. Damita Dunnings know.

## 2017-08-08 NOTE — Telephone Encounter (Signed)
Erroneous encounter

## 2017-11-03 ENCOUNTER — Other Ambulatory Visit: Payer: Self-pay | Admitting: Family Medicine

## 2017-11-03 DIAGNOSIS — E119 Type 2 diabetes mellitus without complications: Secondary | ICD-10-CM

## 2017-11-05 ENCOUNTER — Other Ambulatory Visit (INDEPENDENT_AMBULATORY_CARE_PROVIDER_SITE_OTHER): Payer: BC Managed Care – PPO

## 2017-11-05 DIAGNOSIS — E119 Type 2 diabetes mellitus without complications: Secondary | ICD-10-CM | POA: Diagnosis not present

## 2017-11-05 LAB — COMPREHENSIVE METABOLIC PANEL
ALBUMIN: 4.3 g/dL (ref 3.5–5.2)
ALK PHOS: 48 U/L (ref 39–117)
ALT: 29 U/L (ref 0–53)
AST: 15 U/L (ref 0–37)
BUN: 12 mg/dL (ref 6–23)
CO2: 30 mEq/L (ref 19–32)
Calcium: 9.5 mg/dL (ref 8.4–10.5)
Chloride: 103 mEq/L (ref 96–112)
Creatinine, Ser: 0.97 mg/dL (ref 0.40–1.50)
GFR: 89.25 mL/min (ref >60.00)
Glucose, Bld: 203 mg/dL — ABNORMAL HIGH (ref 70–99)
POTASSIUM: 4.4 meq/L (ref 3.5–5.1)
Sodium: 137 mEq/L (ref 135–145)
TOTAL PROTEIN: 7 g/dL (ref 6.0–8.3)
Total Bilirubin: 0.4 mg/dL (ref 0.2–1.2)

## 2017-11-05 LAB — HEMOGLOBIN A1C: HEMOGLOBIN A1C: 9 % — AB (ref 4.6–6.5)

## 2017-11-05 LAB — LIPID PANEL
CHOL/HDL RATIO: 4
CHOLESTEROL: 153 mg/dL (ref 0–200)
HDL: 35.7 mg/dL — ABNORMAL LOW (ref >39.00)
Triglycerides: 425 mg/dL — ABNORMAL HIGH (ref 0.0–149.0)

## 2017-11-05 LAB — LDL CHOLESTEROL, DIRECT: LDL DIRECT: 69 mg/dL

## 2017-11-07 ENCOUNTER — Other Ambulatory Visit: Payer: Self-pay | Admitting: Family Medicine

## 2017-11-07 NOTE — Telephone Encounter (Signed)
Patient is being seen on Friday, November 08, 2017 for OV, ? Refill at that time.

## 2017-11-08 ENCOUNTER — Ambulatory Visit (INDEPENDENT_AMBULATORY_CARE_PROVIDER_SITE_OTHER): Payer: BC Managed Care – PPO | Admitting: Family Medicine

## 2017-11-08 ENCOUNTER — Encounter: Payer: Self-pay | Admitting: Family Medicine

## 2017-11-08 VITALS — BP 108/78 | HR 80 | Temp 98.3°F | Ht 66.0 in | Wt 227.5 lb

## 2017-11-08 DIAGNOSIS — Z0000 Encounter for general adult medical examination without abnormal findings: Secondary | ICD-10-CM | POA: Diagnosis not present

## 2017-11-08 DIAGNOSIS — Z7189 Other specified counseling: Secondary | ICD-10-CM

## 2017-11-08 DIAGNOSIS — I10 Essential (primary) hypertension: Secondary | ICD-10-CM

## 2017-11-08 DIAGNOSIS — E119 Type 2 diabetes mellitus without complications: Secondary | ICD-10-CM

## 2017-11-08 DIAGNOSIS — E782 Mixed hyperlipidemia: Secondary | ICD-10-CM

## 2017-11-08 DIAGNOSIS — C439 Malignant melanoma of skin, unspecified: Secondary | ICD-10-CM

## 2017-11-08 MED ORDER — SITAGLIPTIN PHOSPHATE 100 MG PO TABS: 50.0000 mg | ORAL_TABLET | Freq: Every day | ORAL | 12 refills | Status: DC

## 2017-11-08 MED ORDER — BASAGLAR KWIKPEN 100 UNIT/ML ~~LOC~~ SOPN: PEN_INJECTOR | SUBCUTANEOUS | 3 refills | Status: DC

## 2017-11-08 MED ORDER — LISINOPRIL 10 MG PO TABS: 10.0000 mg | ORAL_TABLET | Freq: Every day | ORAL | 3 refills | Status: DC

## 2017-11-08 MED ORDER — SIMVASTATIN 40 MG PO TABS: 40.0000 mg | ORAL_TABLET | Freq: Every day | ORAL | 3 refills | Status: DC

## 2017-11-08 NOTE — Telephone Encounter (Signed)
I can send at [place]. Thanks.

## 2017-11-08 NOTE — Telephone Encounter (Signed)
Printed and given to patient at [place].  Thanks.

## 2017-11-08 NOTE — Progress Notes (Signed)
CPE- See plan.  Routine anticipatory guidance given to patient.  See health maintenance.  The possibility exists that previously documented standard health maintenance information may have been brought forward from a previous encounter into this note.  If needed, that same information has been updated to reflect the current situation based on today's encounter.    Tetanus 2010 Flu 2018 PNA 2015 Shingles not due Colon and prostate cancer screening not due.   Living will d/w pt. Wife designated if patient were incapacitated.  D&E d/w pt.  Weight is up some.  He is going to get back to walking and exercising.  He is tracking his steps.    His youngest daughter made a fast pitch softball team, playing centerfield.    Diabetes:  Using medications without difficulties:yes, taking [place] 100mg  a day with 60 units of insulin.   Hypoglycemic episodes:no Hyperglycemic episodes:no Feet problems:no Blood Sugars averaging: 200 this AM, has been 150-170s recently.   eye exam within last year: yes A1c 9, slightly better than prev.  D/w pt.    Hypertension:    Using medication without problems or lightheadedness: yes Chest pain with exertion:no Edema:no Short of breath:no  Elevated Cholesterol: Using medications without problems:yes Muscle aches: no Diet compliance: encouraged.  Exercise: encouraged Labs d/w pt.   TG elevation noted, likely related to DM2 and weight.    His arm pain is better.    He had derm follow up with lesion on the abd wall removed.  Not a melanoma but had atypical cells, with secondary resection had clear margins per patient report.  I'll defer.  D/w pt.    PMH and SH reviewed  Meds, vitals, and allergies reviewed.   ROS: Per HPI.  Unless specifically indicated otherwise in HPI, the patient denies:  General: fever. Eyes: acute vision changes ENT: sore throat Cardiovascular: chest pain Respiratory: SOB GI: vomiting GU: dysuria Musculoskeletal: acute back  pain Derm: acute rash Neuro: acute motor dysfunction Psych: worsening mood Endocrine: polydipsia Heme: bleeding Allergy: hayfever  GEN: nad, alert and oriented HEENT: mucous membranes moist NECK: supple w/o LA CV: rrr. PULM: ctab, no inc wob ABD: soft, +bs EXT: no edema SKIN: no acute rash  Diabetic foot exam: Normal inspection No skin breakdown B 1st toe calluses  Normal DP pulses Normal sensation to light touch and monofilament Nails normal

## 2017-11-08 NOTE — Patient Instructions (Signed)
Recheck in about 3 months.  The only lab you need to have done for your next diabetic visit is an A1c.  We can do this with a fingerstick test at the office visit.  You do not need a lab visit ahead of time for this.  It does not matter if you are fasting when the lab is done.   Work on diet and exercise in the meantime.  Take care.  Glad to see you.

## 2017-11-10 NOTE — Assessment & Plan Note (Signed)
Living will d/w pt.  Wife designated if patient were incapacitated.   ?

## 2017-11-10 NOTE — Assessment & Plan Note (Signed)
Reasonable control.  Needs work on diet and exercise.  Labs discussed with patient.  He agrees. 

## 2017-11-10 NOTE — Assessment & Plan Note (Signed)
A1c 9, slightly better than prev.  D/w pt.   At this point treatment is diet and exercise and not to change his medications.  With some weight loss his A1c would likely significantly improved.  Discussed with patient.  Recheck in about 3 months.

## 2017-11-10 NOTE — Assessment & Plan Note (Signed)
With triglyceride elevation in spite of statin use.  Needs diet and exercise.  Discussed with patient.  He understood.

## 2017-11-10 NOTE — Assessment & Plan Note (Signed)
Tetanus 2010 Flu 2018 PNA 2015 Shingles not due Colon and prostate cancer screening not due.   Living will d/w pt. Wife designated if patient were incapacitated.  D&E d/w pt.  Weight is up some.  He is going to get back to walking and exercising.  He is tracking his steps.

## 2017-11-10 NOTE — Assessment & Plan Note (Signed)
H/o, per derm.

## 2018-02-14 ENCOUNTER — Ambulatory Visit: Payer: BC Managed Care – PPO | Admitting: Family Medicine

## 2018-02-14 ENCOUNTER — Encounter: Payer: Self-pay | Admitting: Family Medicine

## 2018-02-14 VITALS — BP 138/80 | HR 81 | Temp 98.6°F | Ht 66.0 in | Wt 223.8 lb

## 2018-02-14 DIAGNOSIS — R0683 Snoring: Secondary | ICD-10-CM

## 2018-02-14 DIAGNOSIS — E119 Type 2 diabetes mellitus without complications: Secondary | ICD-10-CM

## 2018-02-14 LAB — POCT GLYCOSYLATED HEMOGLOBIN (HGB A1C): HEMOGLOBIN A1C: 8.3 % — AB (ref 4.0–5.6)

## 2018-02-14 MED ORDER — SITAGLIPTIN PHOSPHATE 100 MG PO TABS
100.0000 mg | ORAL_TABLET | Freq: Every day | ORAL | Status: DC
Start: 2018-02-14 — End: 2018-09-03

## 2018-02-14 NOTE — Progress Notes (Signed)
Diabetes:  Using medications without difficulties: yes Hypoglycemic episodes: only if prolonged fasting.  D/w pt about cautions.  Lowest was 92.  We talked about adjusting his insulin as needed, tapering 1 unit if sugar <100.   Hyperglycemic episodes: no Feet problems: no Blood Sugars averaging: 140-150s usually.  occ higher, occ down to 100, depending on meal prev night.   eye exam within last year: yes A1c improved to 8.3 from 9.   He has been going to the gym in the meantime.  He is down 4 lbs Taking 60 units a day.    GERD controlled with PPI, failed trial off med.  D/w pt about diet and weight loss.    His daughter is still playing softball and doing well.   He is working 4 days a week during the summer and that is a good change for the patient.  He has been fishing some.    He is persistently fatigued, waking tired, going on "for a while."  Snoring a lot.  He may be walking at night from it, wife has noted it.    Meds, vitals, and allergies reviewed.  ROS: Per HPI unless specifically indicated in ROS section   GEN: nad, alert and oriented HEENT: mucous membranes moist NECK: supple w/o LA CV: rrr. PULM: ctab, no inc wob ABD: soft, +bs EXT: no edema SKIN: no acute rash  Diabetic foot exam: Normal inspection No skin breakdown Calluses on B 1st toes.   Normal DP pulses Normal sensation to light touch and monofilament Nails normal

## 2018-02-14 NOTE — Patient Instructions (Addendum)
Recheck in about 4 months.  The only lab you need to have done for your next diabetic visit is an A1c.  We can do this with a fingerstick test at the office visit.  You do not need a lab visit ahead of time for this.  It does not matter if you are fasting when the lab is done.   Take care.  Glad to see you.  We will call about your referral.  Rosaria Ferries or Azalee Course will call you if you don't see one of them on the way out.  Update me as needed.   Keep working on diet and exercise.  Taper 1 unit if sugar <100.

## 2018-02-15 NOTE — Assessment & Plan Note (Signed)
Refer for obstructive sleep apnea testing.  Pathophysiology discussed with patient.  He agrees with plan.  Continue work on diet and exercise.

## 2018-02-15 NOTE — Assessment & Plan Note (Signed)
A1c is improved to 8.3, down from 9.  He is working on diet and going to the gym.  Intentional weight loss noted.  Continue as is with diet and exercise.  If he has low sugars then he can taper his insulin, discussed.  Recheck periodically.  He agrees.

## 2018-02-18 ENCOUNTER — Ambulatory Visit (INDEPENDENT_AMBULATORY_CARE_PROVIDER_SITE_OTHER): Payer: BC Managed Care – PPO | Admitting: Pulmonary Disease

## 2018-02-18 ENCOUNTER — Encounter: Payer: Self-pay | Admitting: Pulmonary Disease

## 2018-02-18 VITALS — BP 144/88 | HR 120 | Ht 66.5 in | Wt 230.0 lb

## 2018-02-18 DIAGNOSIS — R0683 Snoring: Secondary | ICD-10-CM | POA: Diagnosis not present

## 2018-02-18 DIAGNOSIS — G4710 Hypersomnia, unspecified: Secondary | ICD-10-CM | POA: Diagnosis not present
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
## 2018-02-18 NOTE — Patient Instructions (Addendum)
High probability of significant sleep disordered breathing  Obesity  Hypersomnia  Non restorative sleep   Scheduled for split-night study  We will see you back in the office in 2 months

## 2018-03-13 ENCOUNTER — Ambulatory Visit (HOSPITAL_BASED_OUTPATIENT_CLINIC_OR_DEPARTMENT_OTHER): Payer: BC Managed Care – PPO | Attending: Pulmonary Disease | Admitting: Pulmonary Disease

## 2018-03-13 ENCOUNTER — Ambulatory Visit: Payer: BC Managed Care – PPO | Admitting: Family Medicine

## 2018-03-13 ENCOUNTER — Encounter: Payer: Self-pay | Admitting: Family Medicine

## 2018-03-13 VITALS — BP 130/82 | HR 88 | Temp 98.2°F | Ht 66.0 in | Wt 227.8 lb

## 2018-03-13 VITALS — Ht 66.5 in | Wt 230.0 lb

## 2018-03-13 DIAGNOSIS — Z889 Allergy status to unspecified drugs, medicaments and biological substances status: Secondary | ICD-10-CM | POA: Diagnosis not present

## 2018-03-13 DIAGNOSIS — G4710 Hypersomnia, unspecified: Secondary | ICD-10-CM | POA: Insufficient documentation

## 2018-03-13 DIAGNOSIS — R0902 Hypoxemia: Secondary | ICD-10-CM | POA: Insufficient documentation

## 2018-03-13 DIAGNOSIS — R5383 Other fatigue: Secondary | ICD-10-CM | POA: Insufficient documentation

## 2018-03-13 DIAGNOSIS — R0683 Snoring: Secondary | ICD-10-CM

## 2018-03-13 DIAGNOSIS — E119 Type 2 diabetes mellitus without complications: Secondary | ICD-10-CM | POA: Insufficient documentation

## 2018-03-13 DIAGNOSIS — G4733 Obstructive sleep apnea (adult) (pediatric): Secondary | ICD-10-CM | POA: Diagnosis present

## 2018-03-13 MED ORDER — PREDNISONE 20 MG PO TABS: ORAL_TABLET | ORAL | 0 refills | Status: DC

## 2018-03-13 MED ORDER — TRIAMCINOLONE ACETONIDE 0.1 % EX CREA: 1.0000 "application " | TOPICAL_CREAM | Freq: Two times a day (BID) | CUTANEOUS | 1 refills | Status: AC

## 2018-03-13 NOTE — Patient Instructions (Signed)
Use the big tube of Triamcinolone cream twice a day.
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
## 2018-03-21 ENCOUNTER — Telehealth: Payer: Self-pay | Admitting: Pulmonary Disease

## 2018-03-21 DIAGNOSIS — G4734 Idiopathic sleep related nonobstructive alveolar hypoventilation: Secondary | ICD-10-CM

## 2018-03-21 NOTE — Telephone Encounter (Signed)
Called and spoke with [facility] and due to the patients insurance we will have to obtain a ono on room air prior to ordering oxygen for the patient.   Dr. Ander Slade please advise.

## 2018-03-21 NOTE — Telephone Encounter (Signed)
We can go ahead and order overnight oximetry on room air

## 2018-03-21 NOTE — Telephone Encounter (Signed)
Call patient  Polysomnogram is negative for significant sleep disorder, did reveal nocturnal hypoxemia  I am recommending oxygen at 2 L to be used at night  Expectation is that this will improve with significant weight loss, avoid medications that may cause sedation  Regular exercises will also improve sleep quality  We will follow-up in the office in about 3 months

## 2018-03-21 NOTE — Procedures (Signed)
POLYSOMNOGRAPHY  Last, First: Gordon Hudson MRN: [id number] Gender: Male Age (years): 89 Weight (lbs): 227 DOB: 11-Dec-1975 BMI: 37 Primary Care: [name] Epworth Score: 13 Referring: Laurin Coder MD Technician: Jacklynn Bue Interpreting: Laurin Coder MD Study Type: NPSG Ordered Study Type: Split Night CPAP Study date: 03/13/2018 Location: [facility] CLINICAL INFORMATION Gordon Hudson is a 42 year old Male and was referred to the sleep center for evaluation of G47.33 OSA: Adult and Pediatric (327.23). Indications include Diabetes, Excessive Daytime Sleepiness, Fatigue, Snoring, Witnessed Apneas.  MEDICATIONS Patient self administered medications include: N/A. Medications administered during study include No sleep medicine administered.  SLEEP STUDY TECHNIQUE A multi-channel overnight Polysomnography study was performed. The channels recorded and monitored were central and occipital EEG, electrooculogram (EOG), submentalis EMG (chin), nasal and oral airflow, thoracic and abdominal wall motion, anterior tibialis EMG, snore microphone, electrocardiogram, and a pulse oximetry. TECHNICIAN COMMENTS Comments added by Technician: Patient had difficulty initiating sleep. Patient was restless all through the night. Comments added by Scorer: N/A SLEEP ARCHITECTURE The study was initiated at 9:59:40 PM and terminated at 4:27:24 AM. The total recorded time was 387.7 minutes. EEG confirmed total sleep time was 287.5 minutes yielding a sleep efficiency of 74.2%%. Sleep onset after lights out was 47.7 minutes with a REM latency of 193.0 minutes. The patient spent 10.6%% of the night in stage N1 sleep, 53.7%% in stage N2 sleep, 24.5%% in stage N3 and 11.1% in REM. Wake after sleep onset (WASO) was 52.6 minutes. The Arousal Index was 19.4/hour. RESPIRATORY PARAMETERS There were a total of 6 respiratory disturbances out of which 1 were apneas ( 0 obstructive, 0 mixed, 1 central) and 5  hypopneas. The apnea/hypopnea index (AHI) was 1.3 events/hour. The central sleep apnea index was 0.2 events/hour. The REM AHI was 5.6 events/hour and NREM AHI was 0.7 events/hour. The supine AHI was 1.9 events/hour and the non supine AHI was 0.8 supine during 45.04% of sleep. Respiratory disturbances were associated with oxygen desaturation down to a nadir of 82.0% during sleep. The mean oxygen saturation during the study was 91.1%. The cumulative time under 88% oxygen saturation was 5.5 minutes.  LEG MOVEMENT DATA The total leg movements were 95 with a resulting leg movement index of 19.8/hr .Associated arousal with leg movement index was 4.4/hr.  CARDIAC DATA The underlying cardiac rhythm was most consistent with sinus rhythm. Mean heart rate during sleep was 72.5 bpm. Additional rhythm abnormalities include None.   IMPRESSIONS - No Significant Obstructive Sleep apnea(OSA) - EKG showed no cardiac abnormalities. - Mild Oxygen Desaturation, desaturation was over five minutes. - No snoring was audible during this study. - No significant periodic leg movements(PLMs) during sleep. However, no significant associated arousals.   DIAGNOSIS - Nocturnal Hypoxemia (327.26 [G47.36 ICD-10])   RECOMMENDATIONS - Avoid alcohol, sedatives and other CNS depressants that may worsen sleep apnea and disrupt normal sleep architecture. - Sleep hygiene should be reviewed to assess factors that may improve sleep quality. - Weight management and regular exercise should be initiated or continued. - Aggressive weight loss measures should be instituted - Will recommend oxygen supplementation at 2L for nocturnal hypoxemia - it is likely that hypoxemia is related to hypoventilation, this is likely secondary to his obese status, this may improve with significant weight loss.   [Electronically signed] 03/21/2018 07:39 AM  Sherrilyn Rist MD NPI: [id number]

## 2018-03-25 NOTE — Telephone Encounter (Signed)
Patient advised and verblized understanding nothing further need at this time. Order has been placed

## 2018-04-01 ENCOUNTER — Telehealth: Payer: Self-pay | Admitting: Pulmonary Disease

## 2018-04-01 DIAGNOSIS — R0902 Hypoxemia: Secondary | ICD-10-CM

## 2018-04-01 NOTE — Telephone Encounter (Signed)
DME referral   Oximetry reveals hypoxemia  Recent polysomnogram and also revealed hypoxemia  DME referral for oxygen supplementation at night  2 L of oxygen via nasal cannula at night

## 2018-04-02 NOTE — Telephone Encounter (Signed)
Called patient unable to reach left message to give us a call back.

## 2018-04-02 NOTE — Telephone Encounter (Signed)
Spoke with the pt and notified of results/recs per Dr. Ander Slade  He verbalized understanding  Order sent to DME

## 2018-04-02 NOTE — Telephone Encounter (Signed)
Pt returning call. Pt contact number [phone]

## 2018-04-02 NOTE — Telephone Encounter (Signed)
Pt returning call. Pt contact number [phone]/KDP

## 2018-04-02 NOTE — Telephone Encounter (Signed)
lmtcb

## 2018-04-02 NOTE — Telephone Encounter (Signed)
ATC voice mail was full will call back.

## 2018-04-02 NOTE — Addendum Note (Signed)
Addended by: Rosana Berger on: 04/02/2018 04:11 PM   Modules accepted: Orders

## 2018-04-14 ENCOUNTER — Ambulatory Visit: Payer: BC Managed Care – PPO | Admitting: Family Medicine

## 2018-04-14 ENCOUNTER — Encounter: Payer: Self-pay | Admitting: Family Medicine

## 2018-04-14 DIAGNOSIS — J309 Allergic rhinitis, unspecified: Secondary | ICD-10-CM

## 2018-04-14 MED ORDER — FLUTICASONE PROPIONATE 50 MCG/ACT NA SUSP: 2.0000 | Freq: Every day | NASAL | 6 refills | Status: AC

## 2018-04-14 NOTE — Patient Instructions (Addendum)
If needed, add 1 unit a day to get sugar below 150.   Get flonase and start using that also.  If not better in about 10 days, or if worse in the meantime, then let me now.  Take care.  Glad to see you.

## 2018-04-14 NOTE — Assessment & Plan Note (Signed)
Add on flonase and update me as needed.  More likely to be allergic/seasonal instead of infectious. D/w pt. Nontoxic.  He agrees.  Sugar has been ~200 the last few AMs.  dw pt about inc in insulin to get sugar <150, okay to add 1 unit a day.   Last A1c 8.3.  I wouldn't expect sig sugar change from flonase, d/w pt.

## 2018-04-14 NOTE — Progress Notes (Signed)
duration of symptoms: sx going on for the last month, initially thought to be from allergies.  Using allergy meds at baseline, taking mucinex.  Sx note better in the meantime.  Rhinorrhea: yes congestion:yes ear pain: ears feels stuffy sore throat: some Cough: now with cough, more recently.   Myalgias:no Fevers: no He still thought all of his sx were due to allergies.   Using nasal saline but not flonase.    He has OSA f/u pending with Pulmonary.    Sugar has been ~200 the last few AMs.  dw pt about inc in insulin to get sugar <150, okay to add 1 unit a day.   Last A1c 8.3.    Per HPI unless specifically indicated in ROS section  Meds, vitals, and allergies reviewed.   GEN: nad, alert and oriented HEENT: mucous membranes moist, TM w/o erythema, nasal epithelium injected, OP with cobblestoning NECK: supple w/o LA CV: rrr. PULM: ctab, no inc wob EXT: no edema Sinuses not ttp x4

## 2018-04-17 NOTE — Telephone Encounter (Signed)
none

## 2018-04-21 ENCOUNTER — Ambulatory Visit (INDEPENDENT_AMBULATORY_CARE_PROVIDER_SITE_OTHER): Payer: BC Managed Care – PPO | Admitting: Pulmonary Disease

## 2018-04-21 ENCOUNTER — Encounter: Payer: Self-pay | Admitting: Pulmonary Disease

## 2018-04-21 VITALS — BP 122/84 | HR 104 | Ht 66.5 in | Wt 224.2 lb

## 2018-04-21 DIAGNOSIS — J0100 Acute maxillary sinusitis, unspecified: Secondary | ICD-10-CM

## 2018-04-21 MED ORDER — AMOXICILLIN-POT CLAVULANATE 875-125 MG PO TABS: 1.0000 | ORAL_TABLET | Freq: Two times a day (BID) | ORAL | 0 refills | Status: DC
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
## 2018-04-21 NOTE — Patient Instructions (Signed)
Sinusitis  Nocturnal hypoxemia  Oxygen supplementation set up still pending  We will give your course of Augmentin 875 p.o. twice daily for 7 days  We will see you back in the office in 4 to 5 months  Call with any significant concerns

## 2018-05-18 IMAGING — NM NM HEPATO W/GB/PHARM/[PERSON_NAME]
2 series · 12 of 12 positions shown · non-contrast
Comparison: Ultrasound of the abdomen of 11/30/2015

CLINICAL DATA: Right upper quadrant abdominal pain

EXAM:
NUCLEAR MEDICINE HEPATOBILIARY IMAGING WITH GALLBLADDER EF
TECHNIQUE: Sequential images of the abdomen were obtained [DATE] minutes
following intravenous administration of radiopharmaceutical. After
oral ingestion of Ensure, gallbladder ejection fraction was
determined. At 60 min, normal ejection fraction is greater than 33%.
RADIOPHARMACEUTICALS:  5.2 mCi 2c-CCm  Choletec IV

[Series 1: biliary · 4.14mm/px · 6 of 58 frames shown]
[frame 5/58]
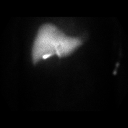
[frame 15/58]
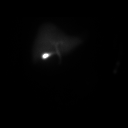
[frame 25/58]
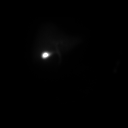
[frame 34/58]
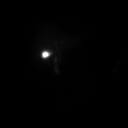
[frame 44/58]
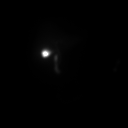
[frame 54/58]
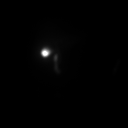

[Series 3: gbef · 4.14mm/px · 6 of 60 frames shown]
[frame 6/60]
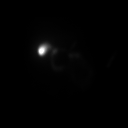
[frame 16/60]
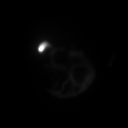
[frame 26/60]
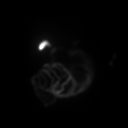
[frame 36/60]
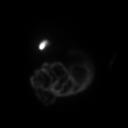
[frame 46/60]
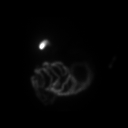
[frame 56/60]
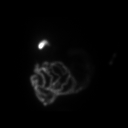

[12 of 12 positions shown; findings below may reference images not displayed]

FINDINGS: Prompt uptake and biliary excretion of activity by the liver is
seen. Gallbladder activity is visualized, consistent with patency of
cystic duct. Biliary activity passes into small bowel, consistent
with patent common bile duct.

Calculated gallbladder ejection fraction is 77%. (Normal gallbladder
ejection fraction with Ensure is greater than 33%.)
IMPRESSION: 1. Normal nuclear medicine hepatobiliary scan.
2. Normal gallbladder ejection fraction of 77%.

## 2018-06-09 LAB — HM DIABETES EYE EXAM

## 2018-06-20 ENCOUNTER — Ambulatory Visit: Payer: BC Managed Care – PPO | Admitting: Family Medicine

## 2018-06-27 ENCOUNTER — Ambulatory Visit: Payer: BC Managed Care – PPO | Admitting: Family Medicine

## 2018-06-27 ENCOUNTER — Encounter: Payer: Self-pay | Admitting: Family Medicine

## 2018-06-27 VITALS — BP 104/80 | HR 93 | Temp 98.4°F | Ht 66.5 in | Wt 222.8 lb

## 2018-06-27 DIAGNOSIS — E119 Type 2 diabetes mellitus without complications: Secondary | ICD-10-CM

## 2018-06-27 LAB — POCT GLYCOSYLATED HEMOGLOBIN (HGB A1C): HEMOGLOBIN A1C: 10.1 % — AB (ref 4.0–5.6)

## 2018-06-27 MED ORDER — BASAGLAR KWIKPEN 100 UNIT/ML ~~LOC~~ SOPN: PEN_INJECTOR | SUBCUTANEOUS | Status: DC

## 2018-06-27 NOTE — Progress Notes (Signed)
Diabetes:  Using medications without difficulties: yes, up to 70 units insulin Hypoglycemic episodes:no Hyperglycemic episodes: see below Feet problems:no Blood Sugars averaging: >200s eye exam within last year: 06/09/18 per patient report, no retinopathy.   A1c up, d/w pt.   He had increased his insulin, see above.   D/w pt about diet, he had been off diet recently.   He hasn't been going to the gym recently.    He was using flonase for nasal congestion.  Using O2 at night, at baseline. He is using a humidifier at night.  He has a dry cough.    Flu shot done 04/29/18 per patient report.  Done at work.    Meds, vitals, and allergies reviewed.   ROS: Per HPI unless specifically indicated in ROS section   GEN: nad, alert and oriented HEENT: mucous membranes moist, TM wnl, nasal exam slightly stuffy NECK: supple w/o LA CV: rrr. PULM: ctab, no inc wob, no cough, no wheeze.  ABD: soft, +bs EXT: no edema SKIN: well perfused.

## 2018-06-27 NOTE — Patient Instructions (Addendum)
Keep adding 1 unit of insulin daily until you get your AM sugar below 150.  Get back in the gym.   Keep working on diet.   Update me about your sugar in early 07/2018.   Stop the lisinopril for 10 days and see if the cough gets better.  If so, then stay off and let me know.  If not better, then restart.   Plan on recheck in 3 months.  The only lab you need to have done for your next diabetic visit is an A1c.  We can do this with a fingerstick test at the office visit.  You do not need a lab visit ahead of time for this.  It does not matter if you are fasting when the lab is done.   Take care.  Glad to see you.

## 2018-06-29 NOTE — Assessment & Plan Note (Signed)
He had increased his insulin, see above.   D/w pt about diet, he had been off diet recently.   He hasn't been going to the gym recently.   He will work more on diet.  He will get back in the gym.  He still may need to increase his insulin.  Discussed options.  See after visit summary.  He will stop the lisinopril for 10 days and see if the cough gets better.  If so, then stay off and let me know.  If not better, then restart.   Plan on recheck in 3 months.   He agrees.

## 2018-07-04 ENCOUNTER — Encounter: Payer: Self-pay | Admitting: Family Medicine

## 2018-08-01 ENCOUNTER — Other Ambulatory Visit: Payer: Self-pay | Admitting: *Deleted

## 2018-08-01 ENCOUNTER — Telehealth: Payer: Self-pay | Admitting: Family Medicine

## 2018-08-01 MED ORDER — INSULIN PEN NEEDLE 32G X 4 MM MISC: 3 refills | Status: DC

## 2018-08-01 NOTE — Telephone Encounter (Signed)
Best number [phone] Pt came in today and needs to get a refill on dm needles  He wanted to know if you would call in bd nano ultra fine pin needs universal fit 4bd nano  32mm x 32g needle He stated he has been getting these over the counter @ [facility].  He insurance will bay for the bd nano needles  [facility] [address]  He would like a 90 day supply

## 2018-08-01 NOTE — Telephone Encounter (Signed)
Sent to pharmacy 

## 2018-08-29 ENCOUNTER — Telehealth: Payer: Self-pay

## 2018-08-29 NOTE — Telephone Encounter (Signed)
Pt's ins changed and now he cannot use coupon for Januvia due to pts new ins. 90 pills cost pt $90.00 and pt cannot afford that. Pt request different coupon or different med to [place]. Pt has one wk of Januvia left. Pt request cb.

## 2018-09-03 MED ORDER — SAXAGLIPTIN HCL 5 MG PO TABS: 5.0000 mg | ORAL_TABLET | Freq: Every day | ORAL | 12 refills | Status: DC

## 2018-09-03 NOTE — Telephone Encounter (Signed)
Have him price check onglyza 5mg  per day and check goodrx.com for a coupon.  rx sent.  If that isn't going to be any better, then let me know.   It is in the same class as [place].  Don't take them together.   Thanks.

## 2018-09-03 NOTE — Telephone Encounter (Signed)
Patient notified as instructed by telephone and verbalized understanding. 

## 2018-09-25 ENCOUNTER — Telehealth: Payer: Self-pay | Admitting: Family Medicine

## 2018-09-25 NOTE — Telephone Encounter (Signed)
Pt called requesting a call back to discuss visit tomorrow. He left msg- he went to pulmonologist- was told to wear oxygen sensor and was told his oxygen dropped to 78 one time. He has been using oxygen since Oct and does not feel any difference- he is still sleepy and tired. Also, he feels the expense is too much- he can't afford the $31 a month. He requested a call back today, he is aware it may need to be discussed at [facility] tomorrow.

## 2018-09-25 NOTE — Telephone Encounter (Signed)
Called pt.  Reasonable to talk about this tomorrow at the [facility].   I can check pulmonary notes in the meantime.  He agrees.

## 2018-09-26 ENCOUNTER — Other Ambulatory Visit: Payer: Self-pay

## 2018-09-26 ENCOUNTER — Encounter: Payer: Self-pay | Admitting: Family Medicine

## 2018-09-26 ENCOUNTER — Ambulatory Visit: Payer: BC Managed Care – PPO | Admitting: Family Medicine

## 2018-09-26 ENCOUNTER — Telehealth: Payer: Self-pay | Admitting: Family Medicine

## 2018-09-26 VITALS — BP 112/84 | HR 77 | Temp 98.6°F | Ht 66.5 in | Wt 223.5 lb

## 2018-09-26 DIAGNOSIS — E119 Type 2 diabetes mellitus without complications: Secondary | ICD-10-CM

## 2018-09-26 LAB — POCT GLYCOSYLATED HEMOGLOBIN (HGB A1C): Hemoglobin A1C: 9.4 % — AB (ref 4.0–5.6)

## 2018-09-26 MED ORDER — SITAGLIPTIN PHOSPHATE 100 MG PO TABS: 100.0000 mg | ORAL_TABLET | Freq: Every day | ORAL | 3 refills | Status: DC

## 2018-09-26 MED ORDER — OMEPRAZOLE MAGNESIUM 20 MG PO TBEC: 20.0000 mg | DELAYED_RELEASE_TABLET | Freq: Every day | ORAL | 3 refills | Status: DC | PRN

## 2018-09-26 NOTE — Progress Notes (Signed)
Nocturnal hypoxia, d/w pt.  No OSA on prev testing, d/w pt about goal for weight loss.   Diabetes:  Using medications without difficulties: yes Hypoglycemic episodes:no Hyperglycemic episodes:no Feet problems:no Blood Sugars averaging: usually ~150s or 160s in the AM, has gradually improved from the last OV. eye exam within last year: yes He isn't on onglyza.  No januvia in the last 3 weeks.  No ADE on januvia prev.   A1c not at goal but improved.  D/w pt.   He is working on diet.  He hasn't been back to the gym.    He is off lisinopril.  His cough resolved.    Taking omeprazole w/o ADE and relief of heartburn.  Failed taper off med totally.    Meds, vitals, and allergies reviewed.   ROS: Per HPI unless specifically indicated in ROS section   GEN: nad, alert and oriented HEENT: mucous membranes moist NECK: supple w/o LA CV: rrr. PULM: ctab, no inc wob ABD: soft, +bs EXT: no edema SKIN: no acute rash  Diabetic foot exam: Normal inspection No skin breakdown No calluses  Normal DP pulses Normal sensation to light touch and monofilament Nails normal

## 2018-09-26 NOTE — Telephone Encounter (Signed)
Pt stated he spoke to his ins company and he has to pay $107 monthly until he met his $1250 deductible before the payment will drop for his oxygen tank. Pt want to know if he can be taken off oxygen. Please advise pt.

## 2018-09-26 NOTE — Patient Instructions (Signed)
Restart januvia.  Cut back on insulin by 1 unit a day if you have any lows.  Keep working on diet and exercise.  I wouldn't stop the O2 at night yet.  Recheck labs prior to a physical in about 3 months.  Update me as needed.  Take care.  Glad to see you.

## 2018-09-28 NOTE — Telephone Encounter (Signed)
Please tell him not to stop it yet.  I will route this over to pulmonary in the meantime to see if they have any other ideas about a cheaper alternative.  Thanks.

## 2018-09-28 NOTE — Assessment & Plan Note (Signed)
He isn't on onglyza.  No januvia in the last 3 weeks.  No ADE on januvia prev.   A1c not at goal but improved.  D/w pt.   He is working on diet.  He hasn't been back to the gym.   ==== Restart januvia.  Cut back on insulin by 1 unit a day if any lows.  Keep working on diet and exercise.  I wouldn't stop the O2 at night yet.  Recheck labs prior to a physical in about 3 months.

## 2018-09-29 ENCOUNTER — Other Ambulatory Visit: Payer: Self-pay | Admitting: *Deleted

## 2018-09-29 ENCOUNTER — Telehealth: Payer: Self-pay | Admitting: Pulmonary Disease

## 2018-09-29 MED ORDER — SITAGLIPTIN PHOSPHATE 100 MG PO TABS: 100.0000 mg | ORAL_TABLET | Freq: Every day | ORAL | 3 refills | Status: DC

## 2018-09-29 NOTE — Telephone Encounter (Signed)
Left detailed message on voicemail.  

## 2018-09-29 NOTE — Telephone Encounter (Signed)
Left message for patient to call back  

## 2018-09-29 NOTE — Telephone Encounter (Signed)
Called patient unable to reach LMTCB 

## 2018-09-29 NOTE — Telephone Encounter (Signed)
LVMTCB x 1 for patient. 

## 2018-09-29 NOTE — Telephone Encounter (Signed)
Called the patient and made him aware of Dr. Judson Roch response. Patient voiced understanding. He stated that the out of pocket cost for the oxygen is not affordable and wanted to know if the DME company ([employer]) would be coming out to pick up the tanks. I told him I was not aware of that and they would have to contact him before they were to come pick up the tanks and hopefully offer alternatives.  Suggested to the patient he try contacting department of social services to see if they have a program that could help him.  Message routed to Dr. Ander Slade for additional question:  Dr. Ander Slade, do you think [facility] would be able to help him out?

## 2018-09-29 NOTE — Telephone Encounter (Signed)
There is no alternative treatment.   Since his sleep study was negative, this is likely due to hypoventilation at night-shallow breathing at night.  He can discontinue oxygen by his request-the risk is that this may predispose him to pulmonary hypertension from nocturnal hypoxemia with consequences down the line--shortness of breath, stress on the heart.  Aggressive weight loss Sleeping with the head of the bed elevated may help but will not correct hypoxemia completely.

## 2018-09-29 NOTE — Telephone Encounter (Signed)
Pt is calling back [phone]

## 2018-09-29 NOTE — Telephone Encounter (Signed)
He may be able to but I am not aware of the types of help they can provide to patient's  If it is okay with him we can help him seek help  If all he wants is just to discontinue the oxygen, we can place an order to the DME noting that it is per the patient's request

## 2018-09-29 NOTE — Telephone Encounter (Signed)
Spoke with pt .  He states that he just spoke to [place] from our office a few minutes ago.  Will close this message and refer to telephone note 09/26/18.

## 2018-09-29 NOTE — Telephone Encounter (Signed)
Thanks for calling patient.  I didn't know if anyone at pulmonary would know of a cheaper O2 supplier.

## 2018-10-01 NOTE — Telephone Encounter (Signed)
I have Spoke with Jeneen Rinks with [employer] the  Are giving the patient a financial hardship paper to see if they can work around the cost before we d/c o2. Will await the outcome of this.

## 2018-10-15 NOTE — Telephone Encounter (Signed)
[employer]  Is awaiting the patient to fill out the paper work at this time will close message they will call if anything further is needed.

## 2018-10-27 ENCOUNTER — Other Ambulatory Visit: Payer: Self-pay | Admitting: Family Medicine

## 2018-11-07 ENCOUNTER — Other Ambulatory Visit: Payer: Self-pay

## 2018-11-07 MED ORDER — BASAGLAR KWIKPEN 100 UNIT/ML ~~LOC~~ SOPN: PEN_INJECTOR | SUBCUTANEOUS | 5 refills | Status: DC

## 2018-11-24 ENCOUNTER — Other Ambulatory Visit: Payer: Self-pay | Admitting: Family Medicine

## 2018-12-13 ENCOUNTER — Ambulatory Visit (HOSPITAL_COMMUNITY)
Admission: EM | Admit: 2018-12-13 | Discharge: 2018-12-13 | Disposition: A | Discharge status: Home / Self Care | Payer: BC Managed Care – PPO | Attending: Family Medicine | Admitting: Family Medicine

## 2018-12-13 ENCOUNTER — Encounter (HOSPITAL_COMMUNITY): Payer: Self-pay

## 2018-12-13 ENCOUNTER — Other Ambulatory Visit: Payer: Self-pay

## 2018-12-13 DIAGNOSIS — S91332A Puncture wound without foreign body, left foot, initial encounter: Secondary | ICD-10-CM | POA: Diagnosis not present

## 2018-12-13 DIAGNOSIS — Z23 Encounter for immunization: Secondary | ICD-10-CM

## 2018-12-13 MED ORDER — AMOXICILLIN-POT CLAVULANATE 875-125 MG PO TABS: 1.0000 | ORAL_TABLET | Freq: Two times a day (BID) | ORAL | 0 refills | Status: AC

## 2018-12-13 MED ORDER — TETANUS-DIPHTH-ACELL PERTUSSIS 5-2.5-18.5 LF-MCG/0.5 IM SUSP
0.5000 mL | Freq: Once | INTRAMUSCULAR | Status: AC
  Administered 2018-12-13: 18:00:00 0.5 mL via INTRAMUSCULAR

## 2018-12-13 MED ORDER — TETANUS-DIPHTH-ACELL PERTUSSIS 5-2.5-18.5 LF-MCG/0.5 IM SUSP
INTRAMUSCULAR | Status: AC
  Filled 2018-12-13: qty 0.5

## 2018-12-13 NOTE — ED Triage Notes (Signed)
Pt presents to get puncture wound checked after stepping on a nail yesterday; pt is diabetic.  wound was cleaned with soap and water and antibacterial ointment was put on it .  Pt needs tetanus shot.

## 2018-12-13 NOTE — Discharge Instructions (Signed)
Cleanse daily with soap and water.  Three days of prophylactic antibiotics.  Please return to be seen if develop redness, swelling, increased pain or drainage
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
## 2018-12-15 ENCOUNTER — Telehealth: Payer: Self-pay

## 2018-12-15 NOTE — Telephone Encounter (Signed)
Patient was seen and treated in the urgent care for wound on 12/13/18.

## 2018-12-15 NOTE — Telephone Encounter (Signed)
[facility] Patient Name: Gordon Hudson Gender: Male DOB: 1976-05-04 Age: 43 Y 85 M 20 D Return Phone Number: [phone] (Primary) Address: City/State/Zip: [place] [address] Client [place] Night - Client Client Site [place] Physician Renford Dills - MD Contact Type Call Who Is Calling Patient / Member / Family / Caregiver Call Type Triage / Clinical Relationship To Patient Self Return Phone Number [phone] (Primary) Chief Complaint Puncture Wound / Stab Wound Reason for Call Symptomatic / Request for [place] states he stepped on a nail yesterday, is diabetic. He soaked foot in dial soap. Hasn't had tetanus shot since 2015. Translation No Nurse Assessment Nurse: Laurena Bering, RN, Helene Kelp Date/Time Eilene Ghazi Time): 12/13/2018 8:38:57 AM Confirm and document reason for call. If symptomatic, describe symptoms. ---Caller states that he stepped on a nail yesterday. Cleaned wound. Last tetanus shot in 2015. Has the patient had close contact with a person known or suspected to have the novel coronavirus illness OR traveled / lives in area with major community spread (including international travel) in the last 14 days from the onset of symptoms? * If Asymptomatic, screen for exposure and travel within the last 14 days. ---No Does the patient have any new or worsening symptoms? ---Yes Will a triage be completed? ---Yes Related visit to physician within the last 2 weeks? ---No Does the PT have any chronic conditions? (i.e. diabetes, asthma, this includes High risk factors for pregnancy, etc.) ---Yes List chronic conditions. ---diabetes, elevated cholesterol, hypertension Is this a behavioral health or substance abuse call? ---No Guidelines Guideline Title Affirmed Question Affirmed Notes  Nurse Date/Time (Eastern Time) Puncture Wound [1] Last tetanus shot > 5 years ago AND [2] DIRTY puncture (e.g., object OR skin was dirty, objects on ground/floor) Laurena Bering, RN, Helene Kelp 12/13/2018 8:40:21 AM Disp. Time Eilene Ghazi Time) Disposition Final User PLEASE NOTE: All timestamps contained within this report are represented as [place] Standard Time. CONFIDENTIALTY NOTICE: This fax transmission is intended only for the addressee. It contains information that is legally privileged, confidential or otherwise protected from use or disclosure. If you are not the intended recipient, you are strictly prohibited from reviewing, disclosing, copying using or disseminating any of this information or taking any action in reliance on or regarding this information. If you have received this fax in error, please notify us immediately by telephone so that we can arrange for its return to [place]. Phone: [phone], Toll-Free: [phone], Fax: [phone] Page: 2 of 2 Call Id: [id number] 12/13/2018 8:42:33 AM See PCP within 24 Hours Yes Laurena Bering, RN, [place] Artist Disagree/Comply Comply Caller Understands Yes PreDisposition Call Doctor Care Advice Given Per Guideline SEE PCP WITHIN 24 HOURS: TETANUS: * You should get a tetanus booster shot in the next 24 hours. [place] THE WOUND: * Wash dirty or clean puncture wounds with warm water and soap for 15 minutes. * For any dirt or debris, scrub the wound surface back and forth with a washcloth to remove it. * If the wound rebleeds a little, that may help remove germs. ANTIBIOTIC OINTMENT: * Apply an antibiotic ointment and a Band-Aid to reduce the risk of infection. * Re-wash the area and re-apply an antibiotic ointment every 12 hours for 2 days. CALL BACK IF: * You become worse. CARE ADVICE given per Puncture Wound (Adult) guideline. Referrals [facility] [place]

## 2018-12-16 NOTE — Telephone Encounter (Signed)
Noted. Thanks.

## 2018-12-16 NOTE — Telephone Encounter (Signed)
Pt states that he is doing fine, swelling is out, took his last antibiotic today 6/2 he continues to soak his foot 1-2x a day and is continuing to keep it covered. But overall pt states its looking better. Advised pt to call if anything changes.

## 2018-12-16 NOTE — Telephone Encounter (Signed)
Noted. Please get update on patient.  Thanks.  

## 2018-12-28 ENCOUNTER — Other Ambulatory Visit: Payer: Self-pay | Admitting: Family Medicine

## 2018-12-28 DIAGNOSIS — E119 Type 2 diabetes mellitus without complications: Secondary | ICD-10-CM

## 2019-01-02 ENCOUNTER — Other Ambulatory Visit: Payer: Self-pay

## 2019-01-02 ENCOUNTER — Other Ambulatory Visit (INDEPENDENT_AMBULATORY_CARE_PROVIDER_SITE_OTHER): Payer: BC Managed Care – PPO

## 2019-01-02 DIAGNOSIS — E119 Type 2 diabetes mellitus without complications: Secondary | ICD-10-CM

## 2019-01-02 LAB — COMPREHENSIVE METABOLIC PANEL WITH GFR
ALT: 46 U/L (ref 0–53)
AST: 30 U/L (ref 0–37)
Albumin: 4.2 g/dL (ref 3.5–5.2)
Alkaline Phosphatase: 45 U/L (ref 39–117)
BUN: 15 mg/dL (ref 6–23)
CO2: 28 meq/L (ref 19–32)
Calcium: 9.2 mg/dL (ref 8.4–10.5)
Chloride: 105 meq/L (ref 96–112)
Creatinine, Ser: 1.01 mg/dL (ref 0.40–1.50)
GFR: 79.72 mL/min (ref >60.00)
Glucose, Bld: 96 mg/dL (ref 70–99)
Potassium: 4.4 meq/L (ref 3.5–5.1)
Sodium: 142 meq/L (ref 135–145)
Total Bilirubin: 0.6 mg/dL (ref 0.2–1.2)
Total Protein: 6.5 g/dL (ref 6.0–8.3)

## 2019-01-02 LAB — LIPID PANEL
Cholesterol: 182 mg/dL (ref 0–200)
HDL: 35.9 mg/dL — ABNORMAL LOW (ref >39.00)
NonHDL: 145.72
Total CHOL/HDL Ratio: 5
Triglycerides: 378 mg/dL — ABNORMAL HIGH (ref 0.0–149.0)
VLDL: 75.6 mg/dL — ABNORMAL HIGH (ref 0.0–40.0)

## 2019-01-02 LAB — MICROALBUMIN / CREATININE URINE RATIO
Creatinine,U: 163.6 mg/dL
Microalb Creat Ratio: 0.5 mg/g (ref 0.0–30.0)
Microalb, Ur: 0.7 mg/dL (ref 0.0–1.9)

## 2019-01-02 LAB — HEMOGLOBIN A1C: Hgb A1c MFr Bld: 9 % — ABNORMAL HIGH (ref 4.6–6.5)

## 2019-01-02 LAB — LDL CHOLESTEROL, DIRECT: Direct LDL: 89 mg/dL

## 2019-01-02 NOTE — Addendum Note (Signed)
Addended by: Ellamae Sia on: 01/02/2019 10:58 AM   Modules accepted: Orders

## 2019-01-09 ENCOUNTER — Ambulatory Visit (INDEPENDENT_AMBULATORY_CARE_PROVIDER_SITE_OTHER): Payer: BC Managed Care – PPO | Admitting: Family Medicine

## 2019-01-09 ENCOUNTER — Other Ambulatory Visit: Payer: Self-pay

## 2019-01-09 ENCOUNTER — Encounter: Payer: Self-pay | Admitting: Family Medicine

## 2019-01-09 VITALS — BP 130/90 | HR 86 | Temp 97.7°F | Resp 18 | Ht 65.5 in | Wt 221.6 lb

## 2019-01-09 DIAGNOSIS — E782 Mixed hyperlipidemia: Secondary | ICD-10-CM

## 2019-01-09 DIAGNOSIS — Z7189 Other specified counseling: Secondary | ICD-10-CM

## 2019-01-09 DIAGNOSIS — Z0000 Encounter for general adult medical examination without abnormal findings: Secondary | ICD-10-CM

## 2019-01-09 DIAGNOSIS — E119 Type 2 diabetes mellitus without complications: Secondary | ICD-10-CM

## 2019-01-09 MED ORDER — SIMVASTATIN 40 MG PO TABS: 40.0000 mg | ORAL_TABLET | Freq: Every day | ORAL | 3 refills | Status: DC

## 2019-01-09 NOTE — Patient Instructions (Signed)
As long as your sugars keep getting better and your A1c comes down gradually, then I would not change your meds.  Recheck in about 3 months, A1c at the visit.  Keep working on your [employer] and diet.   Take care.  Glad to see you.  Update me as needed.

## 2019-01-09 NOTE — Progress Notes (Signed)
CPE- See plan.  Routine anticipatory guidance given to patient.  See health maintenance.  The possibility exists that previously documented standard health maintenance information may have been brought forward from a previous encounter into this note.  If needed, that same information has been updated to reflect the current situation based on today's encounter.    Tetanus 2020 Flu 2019 PNA 2015 Shingles not due Colon and prostate cancer screening not due Living will d/w pt. Wife designated if patient were incapacitated.  D&E d/w pt.    Pandemic considerations d/w pt.  His sugar was lower with less restaurant/take out food.    Diabetes:  Using medications without difficulties: yes Hypoglycemic episodes: rarely, only if prolonged fasting.  Hyperglycemic episodes: no Feet problems: no Blood Sugars averaging: usually ~130-140s, rarely higher, occ lower.  eye exam within last year: yes  Elevated Cholesterol: Using medications without problems:yes Muscle aches: no Diet compliance: encouraged.  Exercise: encouraged Labs d/w pt.    Cough is better off ACE.  D/w pt.   BP usually 110/70s on home checks.    PMH and SH reviewed  Meds, vitals, and allergies reviewed.   ROS: Per HPI.  Unless specifically indicated otherwise in HPI, the patient denies:  General: fever. Eyes: acute vision changes ENT: sore throat Cardiovascular: chest pain Respiratory: SOB GI: vomiting GU: dysuria Musculoskeletal: acute back pain Derm: acute rash Neuro: acute motor dysfunction Psych: worsening mood Endocrine: polydipsia Heme: bleeding Allergy: hayfever  GEN: nad, alert and oriented HEENT: ncat NECK: supple w/o LA CV: rrr. PULM: ctab, no inc wob ABD: soft, +bs EXT: no edema SKIN: no acute rash  Diabetic foot exam: Normal inspection No skin breakdown Calluses smaller than prev, on B 1 toes.  Normal DP pulses Normal sensation to light touch and monofilament Nails normal

## 2019-01-11 NOTE — Assessment & Plan Note (Signed)
Living will d/w pt.  Wife designated if patient were incapacitated.   ?

## 2019-01-11 NOTE — Assessment & Plan Note (Signed)
A1c not at goal but improving.  Discussed with patient As long as his sugars keep getting better and his A1c comes down gradually, then I would not change his meds.  Recheck in about 3 months, A1c at the visit.  Keep working on [employer] and diet.   He agrees.

## 2019-01-11 NOTE — Assessment & Plan Note (Signed)
Continue statin.  Continue work on diet and exercise.  Labs discussed with patient.  He agrees.

## 2019-01-11 NOTE — Assessment & Plan Note (Signed)
Tetanus 2020 Flu 2019 PNA 2015 Shingles not due Colon and prostate cancer screening not due Living will d/w pt. Wife designated if patient were incapacitated.  D&E d/w pt.    Pandemic considerations d/w pt.  His sugar was lower with less restaurant/take out food.

## 2019-01-30 ENCOUNTER — Other Ambulatory Visit: Payer: Self-pay | Admitting: *Deleted

## 2019-01-30 ENCOUNTER — Telehealth: Payer: Self-pay | Admitting: Family Medicine

## 2019-01-30 MED ORDER — GLUCOSE BLOOD VI STRP: ORAL_STRIP | 3 refills | Status: DC

## 2019-01-30 NOTE — Telephone Encounter (Signed)
Best number [phone]  Pt called stating a rx for test strips was call in for qty 200  He can get 250 at same price   [facility] [address]  Can you change rx

## 2019-01-30 NOTE — Telephone Encounter (Signed)
New Rx sent.

## 2019-01-30 NOTE — Telephone Encounter (Signed)
Pt is requesting refills of his test strips glucose blood (ONE TOUCH ULTRA TEST) test strip  [facility] [place], [place] - [place]

## 2019-02-19 ENCOUNTER — Other Ambulatory Visit: Payer: Self-pay | Admitting: Family Medicine

## 2019-03-12 ENCOUNTER — Other Ambulatory Visit: Payer: Self-pay

## 2019-03-12 ENCOUNTER — Telehealth: Payer: Self-pay | Admitting: Family Medicine

## 2019-03-12 ENCOUNTER — Encounter: Payer: Self-pay | Admitting: Internal Medicine

## 2019-03-12 ENCOUNTER — Ambulatory Visit (INDEPENDENT_AMBULATORY_CARE_PROVIDER_SITE_OTHER): Payer: BC Managed Care – PPO | Admitting: Internal Medicine

## 2019-03-12 VITALS — BP 122/70 | HR 79 | Temp 97.8°F

## 2019-03-12 DIAGNOSIS — R05 Cough: Secondary | ICD-10-CM

## 2019-03-12 DIAGNOSIS — Z20822 Contact with and (suspected) exposure to covid-19: Secondary | ICD-10-CM

## 2019-03-12 DIAGNOSIS — Z20828 Contact with and (suspected) exposure to other viral communicable diseases: Secondary | ICD-10-CM

## 2019-03-12 DIAGNOSIS — R519 Headache, unspecified: Secondary | ICD-10-CM

## 2019-03-12 DIAGNOSIS — R51 Headache: Secondary | ICD-10-CM

## 2019-03-12 DIAGNOSIS — R0981 Nasal congestion: Secondary | ICD-10-CM

## 2019-03-12 DIAGNOSIS — R059 Cough, unspecified: Secondary | ICD-10-CM

## 2019-03-12 NOTE — Telephone Encounter (Signed)
Best number [phone]  Pt called stating his coworker tested positive for covid 8/25.  Per pt work he has to be quarantined for 14 days from  8/24 with returned to work date of 03/27/2019.  He needs a work note called end of isolation note before he can go back to work.  Pt was around [employer] 8/24.  Both were wearing mask  The only symptoms pt has is allergies that he has always had. When he blow his nose this morning it was green/bloody.
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
## 2019-03-12 NOTE — Patient Instructions (Signed)
COVID-19: How to Protect Yourself and Others Know how it spreads  There is currently no vaccine to prevent coronavirus disease 2019 (COVID-19).  The best way to prevent illness is to avoid being exposed to this virus.  The virus is thought to spread mainly from person-to-person. ? Between people who are in close contact with one another (within about 6 feet). ? Through respiratory droplets produced when an infected person coughs, sneezes or talks. ? These droplets can land in the mouths or noses of people who are nearby or possibly be inhaled into the lungs. ? Some recent studies have suggested that COVID-19 may be spread by people who are not showing symptoms. Everyone should Clean your hands often  Wash your hands often with soap and water for at least 20 seconds especially after you have been in a public place, or after blowing your nose, coughing, or sneezing.  If soap and water are not readily available, use a hand sanitizer that contains at least 60% alcohol. Cover all surfaces of your hands and rub them together until they feel dry.  Avoid touching your eyes, nose, and mouth with unwashed hands. Avoid close contact  Stay home if you are sick.  Avoid close contact with people who are sick.  Put distance between yourself and other people. ? Remember that some people without symptoms may be able to spread virus. ? This is especially important for people who are at higher risk of getting very sick.www.cdc.gov/coronavirus/2019-ncov/need-extra-precautions/people-at-higher-risk.html Cover your mouth and nose with a cloth face cover when around others  You could spread COVID-19 to others even if you do not feel sick.  Everyone should wear a cloth face cover when they have to go out in public, for example to the grocery store or to pick up other necessities. ? Cloth face coverings should not be placed on young children under age 2, anyone who has trouble breathing, or is unconscious,  incapacitated or otherwise unable to remove the mask without assistance.  The cloth face cover is meant to protect other people in case you are infected.  Do NOT use a facemask meant for a healthcare worker.  Continue to keep about 6 feet between yourself and others. The cloth face cover is not a substitute for social distancing. Cover coughs and sneezes  If you are in a private setting and do not have on your cloth face covering, remember to always cover your mouth and nose with a tissue when you cough or sneeze or use the inside of your elbow.  Throw used tissues in the trash.  Immediately wash your hands with soap and water for at least 20 seconds. If soap and water are not readily available, clean your hands with a hand sanitizer that contains at least 60% alcohol. Clean and disinfect  Clean AND disinfect frequently touched surfaces daily. This includes tables, doorknobs, light switches, countertops, handles, desks, phones, keyboards, toilets, faucets, and sinks. www.cdc.gov/coronavirus/2019-ncov/prevent-getting-sick/disinfecting-your-home.html  If surfaces are dirty, clean them: Use detergent or soap and water prior to disinfection.  Then, use a household disinfectant. You can see a list of EPA-registered household disinfectants here. cdc.gov/coronavirus 11/18/2018 This information is not intended to replace advice given to you by your health care provider. Make sure you discuss any questions you have with your health care provider. Document Released: 10/28/2018 Document Revised: 11/26/2018 Document Reviewed: 10/28/2018 Elsevier Patient Education  2020 Elsevier Inc.  

## 2019-03-13 NOTE — Telephone Encounter (Signed)
Noted, thanks.  It isn't wrong to get tested- I'll await the results.  Thanks.

## 2019-03-13 NOTE — Telephone Encounter (Signed)
Spoke to patient by telephone and was advised that after his visit with Webb Silversmith NP yesterday he went to [facility] to be tested for Covid. Patient stated that he has had a headache for a couple of days and allergy symptoms. Patient stated that he is in quarantine and will stay that way until he gets his results. Advised patient to treat his symptoms as instructed during his visit yesterday. Patient stated that he does not need a isolation note until he can go back to work. Advised patient of ER precautions.

## 2019-03-13 NOTE — Telephone Encounter (Signed)
Please put him on the follow up list.  I don't think it makes sense to get tested yet as he may not have had time to turn positive.  If he is worse in the meantime, then let me know.  Does he need the note now or at the end of isolation?  Please let me know.  Thanks.

## 2019-03-14 LAB — NOVEL CORONAVIRUS, NAA: SARS-CoV-2, NAA: DETECTED — AB

## 2019-03-17 ENCOUNTER — Telehealth: Payer: Self-pay | Admitting: Internal Medicine

## 2019-03-17 MED ORDER — PROMETHAZINE-DM 6.25-15 MG/5ML PO SYRP: 5.0000 mL | ORAL_SOLUTION | Freq: Four times a day (QID) | ORAL | 0 refills | Status: DC | PRN

## 2019-03-17 NOTE — Telephone Encounter (Signed)
Patient test positive for Covid.  Patient's coughing constantly.  Patient has been using Mucinex  and it's breaking it up.  Patient's coughing so much his stomach is sore.  Patient wants to know if he can have a cough medicine called in to [facility] on [address].

## 2019-03-17 NOTE — Telephone Encounter (Signed)
Promethazine DM sent to pharmacy

## 2019-03-17 NOTE — Addendum Note (Signed)
Addended by: Jearld Fenton on: 03/17/2019 02:04 PM   Modules accepted: Orders

## 2019-03-18 ENCOUNTER — Telehealth: Payer: Self-pay

## 2019-03-18 NOTE — Telephone Encounter (Signed)
Spoke with patient for update on how he is doing.    Patient states that he is feeling some better but still has the occasional fever (100.1 plus) mostly at night.  His cough is better with R/X that was sent in yesterday.  Patient is to continue tylenol prn for fevers.  He denies any chest pain or SOB and understands if this develops he is to go to the ER.  He will continue to push fluids and get plenty of rest, remaining under quarantine.   His BS are running borderline high but states that he ate pizza last night which likely contributed to the 200 reading.  They are monitoring his sugars carefully and will let us know if any concerns there.   He did ask when he can return to work and if he will need a repeat negative test first.  I explained the 10 + 3 day quarantine rule and that a repeat negative test is not required on our end before returning to work but that decision is up to his employer.  Negative testing is not recommended on our end to write him back to work as long as he meets the 10+3 rule with symptoms resolving to a safe level. However, we can get this done for him if employer requires.    His wife spoke with me briefly on the phone to reinforce information and report on how he is doing.  She is providing all care support as needed for patient.  At this point, she is negative and asymptomatic but is required to have a repeat test before she can go back to work.   We will discuss further when I call to check on him on Friday.  He knows to call us sooner if needed. Patient states that he has no further questions or concerns at this time.

## 2019-03-18 NOTE — Telephone Encounter (Signed)
Thanks. Noted.

## 2019-03-20 ENCOUNTER — Telehealth: Payer: Self-pay

## 2019-03-20 NOTE — Telephone Encounter (Signed)
Spoke with patient.  He reports the following:  1. Fever last night, after taking 2 tylenol he feels better this am and temp is 98.   2.  Cough is productive with now clearer sputum (was brownish a couple days ago).  He is taking Mucinex DM and R/X cough syrup as needed.   3.  Ongoing nasal/head congestion but is mild at this point.   4. Denies any new acute symptoms and no SOB/chest pain or tightness and knows to go the ER if anything develops over the weekend.   He is aware that he needs to continue on quarantine.  (exposure 8/24, symptom onset 8/27) and still running fever within past 24 hours.    He will call on call this weekend if any questions or concerns or go to ER if severe decline.  Patient aware that I will call him back on Tuesday afternoon to re-evaluate and consider when we may be able to release him back to work, but is not appropriate at this time.   FYI to Dr. Damita Dunnings.

## 2019-03-20 NOTE — Telephone Encounter (Signed)
Noted. Thanks. Agreed.  

## 2019-03-24 ENCOUNTER — Telehealth: Payer: Self-pay | Admitting: *Deleted

## 2019-03-24 NOTE — Telephone Encounter (Signed)
Called patient to follow-up on him because of positive covid results. Patient stated that he has not had a headache for two days and no tylenol for two days. Patient stated that his temperature today is 98.0. Patient stated that he has not had a fever in 3-4 days. Patient stated that his wife went for testing Friday and got positive test results for covid Sunday.

## 2019-03-25 NOTE — Telephone Encounter (Signed)
Based on his timeline he should be allowed to go back to work.  My understanding was that work was going to allow him to return on September 11 assuming he continues to feel well.  I did a note in the meantime.  If he has changes in symptoms then let us know.  I hope his wife feels better soon.  I do not think that is going to affect his timeline if he is still asymptomatic at this point.  Thanks.

## 2019-03-26 NOTE — Telephone Encounter (Signed)
Patient says that because his wife tested positive on Sept 4, his work will not allow him to return until September 16th.

## 2019-03-27 ENCOUNTER — Encounter: Payer: Self-pay | Admitting: Family Medicine

## 2019-03-27 NOTE — Telephone Encounter (Signed)
Letter printed, patient advised.

## 2019-03-27 NOTE — Telephone Encounter (Signed)
Please print him a letter so that he can return to work on the 16th.  Thanks.

## 2019-04-13 ENCOUNTER — Other Ambulatory Visit: Payer: Self-pay

## 2019-04-13 ENCOUNTER — Ambulatory Visit: Payer: BC Managed Care – PPO | Admitting: Family Medicine

## 2019-04-13 ENCOUNTER — Encounter: Payer: Self-pay | Admitting: Family Medicine

## 2019-04-13 VITALS — BP 130/94 | HR 83 | Temp 97.7°F | Ht 65.5 in | Wt 223.4 lb

## 2019-04-13 DIAGNOSIS — E119 Type 2 diabetes mellitus without complications: Secondary | ICD-10-CM | POA: Diagnosis not present

## 2019-04-13 DIAGNOSIS — Z23 Encounter for immunization: Secondary | ICD-10-CM | POA: Diagnosis not present

## 2019-04-13 LAB — POCT GLYCOSYLATED HEMOGLOBIN (HGB A1C): Hemoglobin A1C: 9.3 % — AB (ref 4.0–5.6)

## 2019-04-13 NOTE — Progress Notes (Signed)
Diabetes:  Using medications without difficulties: yes, taking 80 units daily and januvia 100mg  a day.  Hypoglycemic episodes: no Hyperglycemic episodes: yes, see below.   Feet problems: no Blood Sugars averaging: down to 111 recently, lowest reading in the last month.  eye exam within last year: yes A1c 9.3.  He had sugar elevation ever since covid infection.  His fatigue isn't resolved yet.  Taste and smell are returning to baseline.    He has a court date pending for visitation for this daughter.    Meds, vitals, and allergies reviewed.  ROS: Per HPI unless specifically indicated in ROS section   GEN: nad, alert and oriented HEENT: ncat NECK: supple w/o LA CV: rrr. PULM: ctab, no inc wob ABD: soft, +bs EXT: no edema SKIN: well perfused  Diabetic foot exam: Normal inspection No skin breakdown No calluses  Normal DP pulses Normal sensation to light touch and monofilament Nails normal

## 2019-04-13 NOTE — Patient Instructions (Signed)
Don't change your meds for now and recheck in about 3 months.   I would expect your sugar to gradually get better in the meantime.   If not then let me know.  Take care.  Glad to see you.

## 2019-04-16 NOTE — Assessment & Plan Note (Addendum)
A1c 9.3.  He had sugar elevation ever since covid infection.  His fatigue isn't resolved yet.  Taste and smell are returning to baseline.    His sugar elevation since COVID infection is improving in the meantime, down to 111 recently.  Since his sugars are improving in the meantime I would continue as is.  He will continue work on diet and exercise.  If he does not have continued improvement in his sugars in the meantime he will let me know.  Recheck periodically.  He agrees.  I expect that some of his sugar elevation over the last 3 months was related to COVID.

## 2019-05-11 ENCOUNTER — Telehealth: Payer: Self-pay | Admitting: Family Medicine

## 2019-05-11 NOTE — Telephone Encounter (Signed)
Patient stated that he was in on sept 28th due to sugar elevated, He stated that his BS is still elevated in the mornings when he first gets up.[place] He stated that he has not had very many readings that are under 150.    Blood sugar reading this morning 222

## 2019-05-12 NOTE — Telephone Encounter (Signed)
Please advise him to add 1 unit of insulin each day to his total dose of Basaglar until his a.m. sugar is around 150.  Let me know how that goes in about 7-10 days, sooner if needed.  Thanks.

## 2019-05-12 NOTE — Telephone Encounter (Signed)
Patient advised and understands instructions.  

## 2019-07-06 ENCOUNTER — Ambulatory Visit: Payer: BC Managed Care – PPO | Admitting: Family Medicine

## 2019-07-06 LAB — HM DIABETES EYE EXAM

## 2019-07-13 ENCOUNTER — Encounter: Payer: Self-pay | Admitting: Family Medicine

## 2019-07-13 ENCOUNTER — Ambulatory Visit (INDEPENDENT_AMBULATORY_CARE_PROVIDER_SITE_OTHER): Payer: BC Managed Care – PPO | Admitting: Family Medicine

## 2019-07-13 ENCOUNTER — Other Ambulatory Visit: Payer: Self-pay

## 2019-07-13 VITALS — BP 122/80 | HR 100 | Temp 97.4°F | Ht 65.5 in | Wt 229.0 lb

## 2019-07-13 DIAGNOSIS — E119 Type 2 diabetes mellitus without complications: Secondary | ICD-10-CM | POA: Diagnosis not present

## 2019-07-13 LAB — POCT GLYCOSYLATED HEMOGLOBIN (HGB A1C): Hemoglobin A1C: 10.7 % — AB (ref 4.0–5.6)

## 2019-07-13 MED ORDER — BASAGLAR KWIKPEN 100 UNIT/ML ~~LOC~~ SOPN: PEN_INJECTOR | SUBCUTANEOUS | Status: DC

## 2019-07-13 NOTE — Progress Notes (Signed)
This visit occurred during the SARS-CoV-2 public health emergency.  Safety protocols were in place, including screening questions prior to the visit, additional usage of staff PPE, and extensive cleaning of exam room while observing appropriate contact time as indicated for disinfecting solutions.  Diabetes:  Using medications without difficulties: yes Hypoglycemic episodes:no Hyperglycemic episodes: see below.   Feet problems:no Blood Sugars averaging: 232 this AM in spite of inc dose of insulin.  Has occ been down to 150s but more often 200s.   eye exam within last year: 07/06/2019 per pt report.  He reported changes in the R retina.  [facility].   A1c d/w pt at OV. Had been taking up to 100 units a day of insulin.    covid considerations d/w pt.   Diet d/w pt. Usually with protein shake for breakfast, wheat bread sandwich for lunch sometimes with crackers, supper is variable.     His custody situation is better, where he is getting to see his daughter as scheduled.    He has been off supplemental O2 for months with normal >92% sats on home check.    Meds, vitals, and allergies reviewed.  ROS: Per HPI unless specifically indicated in ROS section   GEN: nad, alert and oriented HEENT: ncat NECK: supple w/o LA CV: rrr. PULM: ctab, no inc wob ABD: soft, +bs EXT: no edema SKIN: no acute rash  Diabetic foot exam: Normal inspection No skin breakdown B1st toes with calluses noted, no ulceration.  Normal DP pulses Normal sensation to light touch and monofilament Nails normal

## 2019-07-13 NOTE — Patient Instructions (Signed)
Start back walking and cut back on carbs.  Update me about your sugar next week.  We'll call about an endocrine appointment in the meantime.  We'll make more plans when I get you update on your sugar next week.  Take care.  Glad to see you.

## 2019-07-15 NOTE — Assessment & Plan Note (Signed)
Discussed options.  I want him to start back walking and cut back on carbs.  He agreed. He will update me about his sugar readings next week. We'll call about an endocrine appointment in the meantime.  We'll make more plans when I get an update on his sugar next week.  He agrees to that plan.  I do not think we can make a significant change in his sugar until he cuts back on some carbohydrates, meaning diet and exercise would likely be more important than a med change at this point given his current level of insulin use.

## 2019-07-29 ENCOUNTER — Telehealth: Payer: Self-pay

## 2019-07-29 NOTE — Telephone Encounter (Signed)
Pt scheduled for Endocrinologist 08-25-19.  Pt wanted you to know he is doing very well since starting low card diet since last OV.  His "sugars" have been consistently 150 upon awakening.  Only 2 x have they been around 200.

## 2019-07-29 NOTE — Telephone Encounter (Signed)
Noted.  I'll await the notes from endo.  Would continue as is since his sugars are better.  Thanks.

## 2019-08-02 ENCOUNTER — Other Ambulatory Visit: Payer: Self-pay | Admitting: Family Medicine

## 2019-08-06 ENCOUNTER — Encounter: Payer: Self-pay | Admitting: Optometry

## 2019-08-21 ENCOUNTER — Other Ambulatory Visit: Payer: Self-pay

## 2019-08-25 ENCOUNTER — Encounter: Payer: Self-pay | Admitting: Endocrinology

## 2019-08-25 ENCOUNTER — Other Ambulatory Visit: Payer: Self-pay

## 2019-08-25 ENCOUNTER — Ambulatory Visit: Payer: BC Managed Care – PPO | Admitting: Endocrinology

## 2019-08-25 VITALS — BP 104/70 | HR 87 | Ht 65.5 in | Wt 227.4 lb

## 2019-08-25 DIAGNOSIS — E119 Type 2 diabetes mellitus without complications: Secondary | ICD-10-CM

## 2019-08-25 MED ORDER — TRULICITY 0.75 MG/0.5ML ~~LOC~~ SOAJ: 0.7500 mg | SUBCUTANEOUS | 11 refills | Status: DC

## 2019-08-25 NOTE — Patient Instructions (Addendum)
good diet and exercise significantly improve the control of your diabetes.  please let me know if you wish to be referred to a dietician.  high blood sugar is very risky to your health.  you should see an eye doctor and dentist every year.  It is very important to get all recommended vaccinations.  Controlling your blood pressure and cholesterol drastically reduces the damage diabetes does to your body.  Those who smoke should quit.  Please discuss these with your doctor.  check your blood sugar twice a day.  vary the time of day when you check, between before the 3 meals, and at bedtime.  also check if you have symptoms of your blood sugar being too high or too low.  please keep a record of the readings and bring it to your next appointment here (or you can bring the meter itself).  You can write it on any piece of paper.  please call us sooner if your blood sugar goes below 70, or if you have a lot of readings over 200. I have sent a prescription to your pharmacy, to add Trulicity, and:  Please continue the same other diabetes medications.   Please come back for a follow-up appointment in 1 month.     Bariatric Surgery You have so much to gain by losing weight.  You may have already tried every diet and exercise plan imaginable.  And, you may have sought advice from your family physician, too.   Sometimes, in spite of such diligent efforts, you may not be able to achieve long-term results by yourself.  In cases of severe obesity, bariatric or weight loss surgery is a proven method of achieving long-term weight control.  Our Services Our bariatric surgery programs offer our patients new hope and long-term weight-loss solution.  Since introducing our services in 2003, we have conducted more than 2,400 successful procedures.  Our program is designated as a Programmer, multimedia by the Metabolic and Bariatric Surgery Accreditation and Quality Improvement Program (MBSAQIP), a IT trainer that  sets rigorous patient safety and outcome standards.  Our program is also designated as a Ecologist by [employer].   Our exceptional weight-loss surgery team specializes in diagnosis, treatment, follow-up care, and ongoing support for our patients with severe weight loss challenges.  We currently offer laparoscopic sleeve gastrectomy, gastric bypass, and adjustable gastric band (LAP-BAND).    Attend our [place] Choosing to undergo a bariatric procedure is a big decision, and one that should not be taken lightly.  You now have two options in how you learn about weight-loss surgery - in person or online.  Our objective is to ensure you have all of the information that you need to evaluate the advantages and obligations of this life changing procedure.  Please note that you are not alone in this process, and our experienced team is ready to assist and answer all of your questions.  There are several ways to register for a seminar (either on-line or in person): 1)  Call [phone] 2) Go on-line to [facility] and register for either type of seminar.  [email or web address]
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
## 2019-09-17 ENCOUNTER — Other Ambulatory Visit: Payer: Self-pay

## 2019-09-21 ENCOUNTER — Other Ambulatory Visit: Payer: Self-pay

## 2019-09-21 ENCOUNTER — Ambulatory Visit: Payer: BC Managed Care – PPO | Admitting: Endocrinology

## 2019-09-21 ENCOUNTER — Telehealth: Payer: Self-pay

## 2019-09-21 ENCOUNTER — Encounter: Payer: Self-pay | Admitting: Endocrinology

## 2019-09-21 VITALS — BP 110/80 | HR 98 | Ht 65.5 in | Wt 224.0 lb

## 2019-09-21 DIAGNOSIS — E119 Type 2 diabetes mellitus without complications: Secondary | ICD-10-CM

## 2019-09-21 LAB — POCT GLYCOSYLATED HEMOGLOBIN (HGB A1C): Hemoglobin A1C: 8.2 % — AB (ref 4.0–5.6)

## 2019-09-21 MED ORDER — TRULICITY 1.5 MG/0.5ML ~~LOC~~ SOAJ: 1.5000 mg | SUBCUTANEOUS | 3 refills | Status: DC

## 2019-09-21 NOTE — Patient Instructions (Addendum)
check your blood sugar twice a day.  vary the time of day when you check, between before the 3 meals, and at bedtime.  also check if you have symptoms of your blood sugar being too high or too low.  please keep a record of the readings and bring it to your next appointment here (or you can bring the meter itself).  You can write it on any piece of paper.  please call us sooner if your blood sugar goes below 70, or if you have a lot of readings over 200. I have sent a prescription to your pharmacy, to double the Trulicity, and:  Please continue the same other diabetes medications.   Please come back for a follow-up appointment in 6 weeks.

## 2019-09-21 NOTE — Telephone Encounter (Signed)
Patient called to request new Rx for Trulicity due to old Rx stating twice weekly-Rx should read 1.5 mg weekly-patient would like a call back when this has been done

## 2019-09-21 NOTE — Telephone Encounter (Signed)
Clarification was received from the pharmacy and has been placed on Dr. Cordelia Pen desk for him to review and address.
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
## 2019-10-05 ENCOUNTER — Encounter: Payer: Self-pay | Admitting: Family Medicine

## 2019-10-05 ENCOUNTER — Other Ambulatory Visit: Payer: Self-pay

## 2019-10-05 ENCOUNTER — Ambulatory Visit (INDEPENDENT_AMBULATORY_CARE_PROVIDER_SITE_OTHER): Payer: BC Managed Care – PPO | Admitting: Family Medicine

## 2019-10-05 VITALS — BP 122/82 | HR 96 | Temp 96.9°F | Ht 65.5 in | Wt 222.1 lb

## 2019-10-05 DIAGNOSIS — R109 Unspecified abdominal pain: Secondary | ICD-10-CM

## 2019-10-05 LAB — CBC WITH DIFFERENTIAL/PLATELET
Basophils Absolute: 0 10*3/uL (ref 0.0–0.1)
Basophils Relative: 0.4 % (ref 0.0–3.0)
Eosinophils Absolute: 0.1 10*3/uL (ref 0.0–0.7)
Eosinophils Relative: 1.4 % (ref 0.0–5.0)
HCT: 44.4 % (ref 39.0–52.0)
Hemoglobin: 15.4 g/dL (ref 13.0–17.0)
Lymphocytes Relative: 34.8 % (ref 12.0–46.0)
Lymphs Abs: 2.8 10*3/uL (ref 0.7–4.0)
MCHC: 34.6 g/dL (ref 30.0–36.0)
MCV: 88.4 fl (ref 78.0–100.0)
Monocytes Absolute: 0.5 10*3/uL (ref 0.1–1.0)
Monocytes Relative: 5.8 % (ref 3.0–12.0)
Neutro Abs: 4.7 10*3/uL (ref 1.4–7.7)
Neutrophils Relative %: 57.6 % (ref 43.0–77.0)
Platelets: 235 10*3/uL (ref 150.0–400.0)
RBC: 5.03 Mil/uL (ref 4.22–5.81)
RDW: 13.5 % (ref 11.5–15.5)
WBC: 8.1 10*3/uL (ref 4.0–10.5)

## 2019-10-05 LAB — COMPREHENSIVE METABOLIC PANEL
ALT: 35 U/L (ref 0–53)
AST: 19 U/L (ref 0–37)
Albumin: 4.1 g/dL (ref 3.5–5.2)
Alkaline Phosphatase: 44 U/L (ref 39–117)
BUN: 17 mg/dL (ref 6–23)
CO2: 25 mEq/L (ref 19–32)
Calcium: 9.3 mg/dL (ref 8.4–10.5)
Chloride: 105 mEq/L (ref 96–112)
Creatinine, Ser: 1.07 mg/dL (ref 0.40–1.50)
GFR: 74.34 mL/min (ref >60.00)
Glucose, Bld: 169 mg/dL — ABNORMAL HIGH (ref 70–99)
Potassium: 4.5 mEq/L (ref 3.5–5.1)
Sodium: 137 mEq/L (ref 135–145)
Total Bilirubin: 0.5 mg/dL (ref 0.2–1.2)
Total Protein: 7.1 g/dL (ref 6.0–8.3)

## 2019-10-05 LAB — LIPASE: Lipase: 25 U/L (ref 11.0–59.0)

## 2019-10-05 NOTE — Patient Instructions (Signed)
I'll update Dr. Loanne Drilling.   Go to the lab on the way out.   If you have mychart we'll likely use that to update you.    Hold trulicity this week and update me if you aren't getting better.  Take care.  Glad to see you.

## 2019-10-05 NOTE — Progress Notes (Signed)
This visit occurred during the SARS-CoV-2 public health emergency.  Safety protocols were in place, including screening questions prior to the visit, additional usage of staff PPE, and extensive cleaning of exam room while observing appropriate contact time as indicated for disinfecting solutions.  Diabetes:  Most recent A1c 8.2.  He has f/u pending for next month.  Sugar has been averaging 140s recently.   Sour belching and stool changes, this could be related to trulicity.  D/w pt.  Episodic stomach discomfort, RUQ area.  Off metformin. No blood in stool.  Taking 75-80 units a day.  Had been taking trulicity on Wednesdays.    He and his wife both had some possibly bad food prior to recent GI sx.  Still on PPI at baseline.  No black tarry stools.   Meds, vitals, and allergies reviewed.   ROS: Per HPI.  Unless specifically indicated otherwise in HPI, the patient denies:  GEN: nad, alert and oriented HEENT: ncat NECK: supple w/o LA CV: rrr. PULM: ctab, no inc wob ABD: soft, +bs, abd not ttp EXT: no edema SKIN: no acute rash

## 2019-10-07 ENCOUNTER — Telehealth: Payer: Self-pay | Admitting: Family Medicine

## 2019-10-07 ENCOUNTER — Telehealth: Payer: Self-pay | Admitting: Endocrinology

## 2019-10-07 ENCOUNTER — Encounter: Payer: Self-pay | Admitting: Endocrinology

## 2019-10-07 DIAGNOSIS — R109 Unspecified abdominal pain: Secondary | ICD-10-CM | POA: Insufficient documentation

## 2019-10-07 NOTE — Assessment & Plan Note (Addendum)
Still okay for outpatient follow-up.  Discussed options.  Check routine labs.  See notes on labs. Hold trulicity this week and update me if no getting better.  I will update Dr. Loanne Drilling.

## 2019-10-07 NOTE — Telephone Encounter (Signed)
Patient called in today He stated that he has been in touch with Dr Loanne Drilling.    Patient stopped drinking the carbonated fruit drink and the belching has went away.  Patient wanted to make sure that you were aware.   HE also stated that Dr Loanne Drilling is wanting to reduce his trulicity

## 2019-10-11 NOTE — Telephone Encounter (Signed)
Glad he is doing better.  I will await the follow-up notes from Dr. Loanne Drilling.  Thanks.

## 2019-10-14 ENCOUNTER — Other Ambulatory Visit: Payer: Self-pay | Admitting: Family Medicine

## 2019-11-02 ENCOUNTER — Ambulatory Visit: Payer: BC Managed Care – PPO | Admitting: Endocrinology

## 2019-11-02 ENCOUNTER — Encounter: Payer: Self-pay | Admitting: Endocrinology

## 2019-11-02 ENCOUNTER — Other Ambulatory Visit: Payer: Self-pay

## 2019-11-02 VITALS — BP 110/80 | HR 88 | Ht 65.5 in | Wt 214.0 lb

## 2019-11-02 DIAGNOSIS — E119 Type 2 diabetes mellitus without complications: Secondary | ICD-10-CM | POA: Diagnosis not present

## 2019-11-02 MED ORDER — BASAGLAR KWIKPEN 100 UNIT/ML ~~LOC~~ SOPN: 30.0000 [IU] | PEN_INJECTOR | SUBCUTANEOUS | 0 refills | Status: DC

## 2019-11-02 MED ORDER — TRULICITY 3 MG/0.5ML ~~LOC~~ SOAJ: 3.0000 mg | SUBCUTANEOUS | 3 refills | Status: DC

## 2019-11-02 NOTE — Patient Instructions (Addendum)
check your blood sugar twice a day.  vary the time of day when you check, between before the 3 meals, and at bedtime.  also check if you have symptoms of your blood sugar being too high or too low.  please keep a record of the readings and bring it to your next appointment here (or you can bring the meter itself).  You can write it on any piece of paper.  please call us sooner if your blood sugar goes below 70, or if you have a lot of readings over 200. I have sent a prescription to your pharmacy, to double the Trulicity again, and:  Reduce the Basaglar to 30 units each morning.  Please continue the same Januvia. Please come back for a follow-up appointment in 6 weeks.
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
## 2019-12-04 ENCOUNTER — Other Ambulatory Visit: Payer: Self-pay

## 2019-12-08 ENCOUNTER — Ambulatory Visit (INDEPENDENT_AMBULATORY_CARE_PROVIDER_SITE_OTHER): Payer: Self-pay | Admitting: Endocrinology

## 2019-12-08 ENCOUNTER — Encounter: Payer: Self-pay | Admitting: Endocrinology

## 2019-12-08 ENCOUNTER — Other Ambulatory Visit: Payer: Self-pay

## 2019-12-08 VITALS — BP 110/80 | HR 87 | Ht 65.5 in | Wt 204.0 lb

## 2019-12-08 DIAGNOSIS — E119 Type 2 diabetes mellitus without complications: Secondary | ICD-10-CM

## 2019-12-08 LAB — POCT GLYCOSYLATED HEMOGLOBIN (HGB A1C): Hemoglobin A1C: 6.1 % — AB (ref 4.0–5.6)

## 2019-12-08 MED ORDER — TRULICITY 4.5 MG/0.5ML ~~LOC~~ SOAJ: 4.5000 mg | SUBCUTANEOUS | 3 refills | Status: DC

## 2019-12-08 MED ORDER — BASAGLAR KWIKPEN 100 UNIT/ML ~~LOC~~ SOPN: 20.0000 [IU] | PEN_INJECTOR | SUBCUTANEOUS | 0 refills | Status: DC
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
## 2019-12-08 NOTE — Patient Instructions (Addendum)
check your blood sugar twice a day.  vary the time of day when you check, between before the 3 meals, and at bedtime.  also check if you have symptoms of your blood sugar being too high or too low.  please keep a record of the readings and bring it to your next appointment here (or you can bring the meter itself).  You can write it on any piece of paper.  please call us sooner if your blood sugar goes below 70, or if you have a lot of readings over 200. I have sent a prescription to your pharmacy, to increase the Trulicity again, and:  Reduce the Basaglar to 20 units each morning.  Please stop taking the Januvia. Please come back for a follow-up appointment in 2 months.

## 2020-02-17 ENCOUNTER — Ambulatory Visit: Payer: PRIVATE HEALTH INSURANCE | Admitting: Endocrinology

## 2020-03-02 ENCOUNTER — Ambulatory Visit: Payer: PRIVATE HEALTH INSURANCE | Admitting: Endocrinology

## 2020-03-03 ENCOUNTER — Encounter: Payer: Self-pay | Admitting: Endocrinology

## 2020-03-03 ENCOUNTER — Ambulatory Visit (INDEPENDENT_AMBULATORY_CARE_PROVIDER_SITE_OTHER): Payer: PRIVATE HEALTH INSURANCE | Admitting: Endocrinology

## 2020-03-03 VITALS — BP 110/64 | HR 102 | Ht 65.5 in | Wt 200.2 lb

## 2020-03-03 DIAGNOSIS — E119 Type 2 diabetes mellitus without complications: Secondary | ICD-10-CM | POA: Diagnosis not present

## 2020-03-03 LAB — POCT GLYCOSYLATED HEMOGLOBIN (HGB A1C): Hemoglobin A1C: 7.5 % — AB (ref 4.0–5.6)

## 2020-03-03 MED ORDER — DAPAGLIFLOZIN PROPANEDIOL 5 MG PO TABS: 5.0000 mg | ORAL_TABLET | Freq: Every day | ORAL | 3 refills | Status: DC

## 2020-03-03 MED ORDER — TRULICITY 4.5 MG/0.5ML ~~LOC~~ SOAJ: 4.5000 mg | SUBCUTANEOUS | 3 refills | Status: DC

## 2020-03-03 MED ORDER — BASAGLAR KWIKPEN 100 UNIT/ML ~~LOC~~ SOPN: 15.0000 [IU] | PEN_INJECTOR | SUBCUTANEOUS | 3 refills | Status: DC

## 2020-03-03 NOTE — Patient Instructions (Signed)
I have sent a prescription to your pharmacy, to add "farxiga." Please reduce the basaglar to 15 units each morning. Please continue the same Trulicity Please come back for a follow-up appointment in 2 months.
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
## 2020-03-07 ENCOUNTER — Other Ambulatory Visit: Payer: Self-pay | Admitting: *Deleted

## 2020-03-07 ENCOUNTER — Telehealth: Payer: Self-pay | Admitting: Family Medicine

## 2020-03-07 MED ORDER — GLUCOSE BLOOD VI STRP: ORAL_STRIP | 3 refills | Status: DC

## 2020-03-07 MED ORDER — SIMVASTATIN 40 MG PO TABS: 40.0000 mg | ORAL_TABLET | Freq: Every day | ORAL | 3 refills | Status: DC

## 2020-03-07 NOTE — Telephone Encounter (Signed)
Medication Refill - Medication:  simvastatin (ZOCOR) 40 MG tablet  glucose blood (ONE TOUCH ULTRA TEST) test strip   Has the patient contacted their pharmacy?  Yes advised to call.   Preferred Pharmacy (with phone number or street name): [place], [place] Phone:  [phone]  Fax:  [phone]

## 2020-03-07 NOTE — Telephone Encounter (Signed)
Refills sent

## 2020-03-08 ENCOUNTER — Telehealth: Payer: Self-pay | Admitting: Family Medicine

## 2020-03-08 NOTE — Telephone Encounter (Signed)
[facility] called and said pt's insurance [employer] doesn't cover One Touch and is asking for you to send a new prescription for Contour Brand test strips, supplies, lancets.

## 2020-03-09 NOTE — Telephone Encounter (Signed)
Pt called needing to get contour brand meter test strips and supplies lancets sent [facility] [place] [facility]  [address] [place]  Best number [phone]

## 2020-03-09 NOTE — Telephone Encounter (Signed)
Please sent #300 of each with 3 rf, sig check sugar up to 3 times daily.  Dx DM2 treated with insulin.  Thanks.

## 2020-03-11 ENCOUNTER — Other Ambulatory Visit: Payer: Self-pay | Admitting: *Deleted

## 2020-03-11 MED ORDER — CONTOUR BLOOD GLUCOSE SYSTEM W/DEVICE KIT: PACK | 0 refills | Status: AC

## 2020-03-11 MED ORDER — CONTOUR TEST VI STRP: ORAL_STRIP | 3 refills | Status: DC

## 2020-03-11 MED ORDER — LANCETS 33G MISC: 3 refills | Status: DC

## 2020-03-11 NOTE — Telephone Encounter (Signed)
Rx sent to preferred pharmacy.

## 2020-05-04 ENCOUNTER — Ambulatory Visit: Payer: PRIVATE HEALTH INSURANCE | Admitting: Endocrinology

## 2020-05-04 DIAGNOSIS — E119 Type 2 diabetes mellitus without complications: Secondary | ICD-10-CM

## 2020-09-19 ENCOUNTER — Telehealth: Payer: Self-pay | Admitting: Family Medicine

## 2020-09-19 NOTE — Telephone Encounter (Signed)
He needs a refill for the of the basaglar.  [place], [address], [place]  [phone]

## 2020-09-20 MED ORDER — BASAGLAR KWIKPEN 100 UNIT/ML ~~LOC~~ SOPN: 15.0000 [IU] | PEN_INJECTOR | SUBCUTANEOUS | 3 refills | Status: DC

## 2020-09-20 NOTE — Telephone Encounter (Signed)
Rx refilled and patient made DM f/u for 09/30/20 at 3:30 pm

## 2020-09-30 ENCOUNTER — Other Ambulatory Visit: Payer: Self-pay

## 2020-09-30 ENCOUNTER — Encounter: Payer: Self-pay | Admitting: Family Medicine

## 2020-09-30 ENCOUNTER — Ambulatory Visit: Payer: PRIVATE HEALTH INSURANCE | Admitting: Family Medicine

## 2020-09-30 VITALS — BP 124/88 | HR 92 | Temp 98.3°F | Ht 66.0 in | Wt 201.0 lb

## 2020-09-30 DIAGNOSIS — Z0000 Encounter for general adult medical examination without abnormal findings: Secondary | ICD-10-CM

## 2020-09-30 DIAGNOSIS — E119 Type 2 diabetes mellitus without complications: Secondary | ICD-10-CM

## 2020-09-30 LAB — POCT GLYCOSYLATED HEMOGLOBIN (HGB A1C): Hemoglobin A1C: 8.5 % — AB (ref 4.0–5.6)

## 2020-09-30 MED ORDER — INSULIN PEN NEEDLE 32G X 4 MM MISC: 3 refills | Status: DC

## 2020-09-30 MED ORDER — BASAGLAR KWIKPEN 100 UNIT/ML ~~LOC~~ SOPN: 15.0000 [IU] | PEN_INJECTOR | SUBCUTANEOUS | 3 refills | Status: DC

## 2020-09-30 NOTE — Patient Instructions (Addendum)
I would add 1 unit of insulin daily until your AM sugar is below 150.  Go to the lab on the way out.   If you have mychart we'll likely use that to update you.    Take care.  Glad to see you. Update me as needed.  Plan on recheck in about 3 months.  A1c at the visit.

## 2020-09-30 NOTE — Progress Notes (Signed)
This visit occurred during the SARS-CoV-2 public health emergency.  Safety protocols were in place, including screening questions prior to the visit, additional usage of staff PPE, and extensive cleaning of exam room while observing appropriate contact time as indicated for disinfecting solutions.  Diabetes:  Using medications without difficulties: yes Hypoglycemic episodes: no Hyperglycemic episodes: no Feet problems: no Blood Sugars averaging: up to 190s recently, when off diet.   eye exam within last year: due, d/w pt.  He'll call about an appointment.   On farxiga, glargine, trulicity.    See notes on follow-up labs  Meds, vitals, and allergies reviewed.   ROS: Per HPI unless specifically indicated in ROS section   GEN: nad, alert and oriented HEENT: ncat NECK: supple w/o LA CV: rrr. PULM: ctab, no inc wob ABD: soft, +bs EXT: no edema SKIN: no acute rash  Diabetic foot exam: Normal inspection No skin breakdown No calluses except for B 1st toe medially- is filing at home.   Normal DP pulses Normal sensation to light touch and monofilament Nails normal

## 2020-10-01 LAB — COMPREHENSIVE METABOLIC PANEL
AG Ratio: 1.8 (calc) (ref 1.0–2.5)
ALT: 22 U/L (ref 9–46)
AST: 15 U/L (ref 10–40)
Albumin: 4.4 g/dL (ref 3.6–5.1)
Alkaline phosphatase (APISO): 62 U/L (ref 36–130)
BUN: 17 mg/dL (ref 7–25)
CO2: 25 mmol/L (ref 20–32)
Calcium: 9.5 mg/dL (ref 8.6–10.3)
Chloride: 104 mmol/L (ref 98–110)
Creat: 0.93 mg/dL (ref 0.60–1.35)
Globulin: 2.5 g/dL (calc) (ref 1.9–3.7)
Glucose, Bld: 157 mg/dL — ABNORMAL HIGH (ref 65–99)
Potassium: 4.5 mmol/L (ref 3.5–5.3)
Sodium: 139 mmol/L (ref 135–146)
Total Bilirubin: 0.4 mg/dL (ref 0.2–1.2)
Total Protein: 6.9 g/dL (ref 6.1–8.1)

## 2020-10-01 LAB — LIPID PANEL
Cholesterol: 148 mg/dL (ref <200)
HDL: 41 mg/dL (ref >=40)
LDL Cholesterol (Calc): 60 mg/dL (calc)
Non-HDL Cholesterol (Calc): 107 mg/dL (calc) (ref <130)
Total CHOL/HDL Ratio: 3.6 (calc) (ref <5.0)
Triglycerides: 394 mg/dL — ABNORMAL HIGH (ref <150)

## 2020-10-01 LAB — CBC WITH DIFFERENTIAL/PLATELET
Absolute Monocytes: 561 cells/uL (ref 200–950)
Basophils Absolute: 61 cells/uL (ref 0–200)
Basophils Relative: 0.5 %
Eosinophils Absolute: 122 cells/uL (ref 15–500)
Eosinophils Relative: 1 %
HCT: 44.8 % (ref 38.5–50.0)
Hemoglobin: 15.8 g/dL (ref 13.2–17.1)
Lymphs Abs: 3782 cells/uL (ref 850–3900)
MCH: 30.7 pg (ref 27.0–33.0)
MCHC: 35.3 g/dL (ref 32.0–36.0)
MCV: 87 fL (ref 80.0–100.0)
MPV: 9.2 fL (ref 7.5–12.5)
Monocytes Relative: 4.6 %
Neutro Abs: 7674 cells/uL (ref 1500–7800)
Neutrophils Relative %: 62.9 %
Platelets: 255 10*3/uL (ref 140–400)
RBC: 5.15 10*6/uL (ref 4.20–5.80)
RDW: 13.1 % (ref 11.0–15.0)
Total Lymphocyte: 31 %
WBC: 12.2 10*3/uL — ABNORMAL HIGH (ref 3.8–10.8)

## 2020-10-01 LAB — MICROALBUMIN / CREATININE URINE RATIO
Creatinine, Urine: 39 mg/dL (ref 20–320)
Microalb, Ur: <0.2 mg/dL

## 2020-10-01 LAB — TSH: TSH: 1.66 mIU/L (ref 0.40–4.50)

## 2020-10-02 NOTE — Assessment & Plan Note (Signed)
A1c done at office visit.  Discussed with patient. On Farxiga, glargine, Trulicity. I would add 1 unit of insulin daily until his AM sugar is below 150.  See notes on labs. He will update me as needed.  Plan on recheck in about 3 months.  A1c at the visit.

## 2020-10-04 ENCOUNTER — Other Ambulatory Visit: Payer: Self-pay

## 2020-10-04 NOTE — Telephone Encounter (Signed)
PA was done yesterday for [employer] 100 unil/ML pen injectors. PA was denied because patient has not tried and failed lantus and toujeo. Please advise.

## 2020-10-05 ENCOUNTER — Telehealth: Payer: Self-pay | Admitting: Family Medicine

## 2020-10-05 MED ORDER — LANTUS SOLOSTAR 100 UNIT/ML ~~LOC~~ SOPN: PEN_INJECTOR | SUBCUTANEOUS | 99 refills | Status: DC

## 2020-10-05 NOTE — Telephone Encounter (Signed)
Notified patient of change in medication. Patient states he has new insurance and that's why they will not cover rx anymore. He is okay with the change and wants sent to [facility] [place] [facility]; erx sent.

## 2020-10-05 NOTE — Addendum Note (Signed)
Addended by: Sherrilee Gilles B on: 10/05/2020 12:04 PM   Modules accepted: Orders

## 2020-10-05 NOTE — Telephone Encounter (Signed)
Pt called in wanted to let Dr. Damita Dunnings know that his inusrance will not cover the blasgar and he is needing something different and he is taking his last dose tonight

## 2020-10-05 NOTE — Telephone Encounter (Signed)
See previous phone note. New rx has been sent in for patient and patient is aware.

## 2020-10-05 NOTE — Addendum Note (Signed)
Addended by: [name] on: 10/05/2020 11:00 AM   Modules accepted: Orders

## 2020-10-05 NOTE — Telephone Encounter (Addendum)
Notify pt.  Would change to lantus. Please verify pharmacy and then send rx.  No change in sig.  Thanks.

## 2020-11-09 ENCOUNTER — Other Ambulatory Visit: Payer: Self-pay

## 2020-11-09 ENCOUNTER — Encounter: Payer: Self-pay | Admitting: Internal Medicine

## 2020-11-09 ENCOUNTER — Ambulatory Visit: Payer: PRIVATE HEALTH INSURANCE | Admitting: Internal Medicine

## 2020-11-09 VITALS — BP 108/76 | HR 95 | Temp 97.5°F | Ht 66.0 in | Wt 199.0 lb

## 2020-11-09 DIAGNOSIS — R0600 Dyspnea, unspecified: Secondary | ICD-10-CM | POA: Diagnosis not present

## 2020-11-09 DIAGNOSIS — R222 Localized swelling, mass and lump, trunk: Secondary | ICD-10-CM | POA: Diagnosis not present

## 2020-11-09 DIAGNOSIS — R0609 Other forms of dyspnea: Secondary | ICD-10-CM | POA: Insufficient documentation

## 2020-11-09 NOTE — Assessment & Plan Note (Signed)
Reassured--this is the xiphoid and he may just have strained it No action
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
## 2020-11-09 NOTE — Assessment & Plan Note (Addendum)
Notes increased DOE and tachycardia over the past year---since second COVID vaccine No rub or clear symptoms of pericarditis Will check echocardiogram--consider cardiology evaluation (he would prefer [facility] if needed--[facility])

## 2020-11-24 ENCOUNTER — Other Ambulatory Visit: Payer: Self-pay

## 2020-11-24 ENCOUNTER — Ambulatory Visit (HOSPITAL_COMMUNITY)
Admission: RE | Admit: 2020-11-24 | Discharge: 2020-11-24 | Disposition: A | Discharge status: Home / Self Care | Payer: PRIVATE HEALTH INSURANCE | Source: Ambulatory Visit | Attending: Internal Medicine | Admitting: Internal Medicine

## 2020-11-24 DIAGNOSIS — E119 Type 2 diabetes mellitus without complications: Secondary | ICD-10-CM | POA: Diagnosis not present

## 2020-11-24 DIAGNOSIS — E785 Hyperlipidemia, unspecified: Secondary | ICD-10-CM | POA: Diagnosis not present

## 2020-11-24 DIAGNOSIS — R0609 Other forms of dyspnea: Secondary | ICD-10-CM

## 2020-11-24 DIAGNOSIS — R0600 Dyspnea, unspecified: Secondary | ICD-10-CM | POA: Insufficient documentation

## 2020-11-24 LAB — ECHOCARDIOGRAM COMPLETE
Area-P 1/2: 2.87 cm2
S' Lateral: 2.7 cm

## 2020-11-24 NOTE — Progress Notes (Signed)
  Echocardiogram 2D Echocardiogram has been performed.  Jennette Dubin 11/24/2020, 3:44 PM

## 2020-11-24 NOTE — Progress Notes (Signed)
FYI

## 2021-01-02 ENCOUNTER — Ambulatory Visit: Payer: PRIVATE HEALTH INSURANCE | Admitting: Family Medicine

## 2021-01-02 ENCOUNTER — Encounter: Payer: Self-pay | Admitting: Family Medicine

## 2021-01-02 ENCOUNTER — Other Ambulatory Visit: Payer: Self-pay

## 2021-01-02 VITALS — BP 110/80 | HR 91 | Temp 97.0°F | Ht 66.0 in | Wt 201.0 lb

## 2021-01-02 DIAGNOSIS — Z85820 Personal history of malignant melanoma of skin: Secondary | ICD-10-CM

## 2021-01-02 DIAGNOSIS — E119 Type 2 diabetes mellitus without complications: Secondary | ICD-10-CM | POA: Diagnosis not present

## 2021-01-02 LAB — POCT GLYCOSYLATED HEMOGLOBIN (HGB A1C): Hemoglobin A1C: 7.8 % — AB (ref 4.0–5.6)

## 2021-01-02 MED ORDER — LANTUS SOLOSTAR 100 UNIT/ML ~~LOC~~ SOPN: PEN_INJECTOR | SUBCUTANEOUS | 99 refills | Status: DC

## 2021-01-02 NOTE — Patient Instructions (Signed)
Don't change your meds for now.  Plan on recheck in about 3-4 months. A1c at the visit.  We'll call about seeing dermatology.  Take care.  Glad to see you.

## 2021-01-02 NOTE — Progress Notes (Signed)
This visit occurred during the SARS-CoV-2 public health emergency.  Safety protocols were in place, including screening questions prior to the visit, additional usage of staff PPE, and extensive cleaning of exam room while observing appropriate contact time as indicated for disinfecting solutions.  Diabetes:  Using medications without difficulties:yes Hypoglycemic episodes:no Hyperglycemic episodes:no Feet problems:no Blood Sugars averaging: 146 this AM, but had been higher (160s) prev eye exam within last year: due, d/w pt.   A1c done at [facility].  Improved to 7.8.   35 units insulin today.  Still on farxiga and trulicity at baseline.   He is on lower carb diet.  D/w pt about diet and exercise.  He is walking >10K steps at work.    D/w pt about dermatology referral re: h/o melanoma.  See skin exam below.    Meds, vitals, and allergies reviewed.   ROS: Per HPI unless specifically indicated in ROS section   GEN: nad, alert and oriented HEENT: ncat NECK: supple w/o LA CV: rrr. PULM: ctab, no inc wob ABD: soft, +bs EXT: no edema SKIN: no acute rash but 4x79mm macule on L arm.  No change in size.  Feels slightly rougher on the surface.  No ulceration.

## 2021-01-04 NOTE — Assessment & Plan Note (Signed)
Refer back to dermatology.  See exam above.  No ominous findings.

## 2021-01-04 NOTE — Assessment & Plan Note (Signed)
A1c done at [facility].  Improved to 7.8.   35 units insulin today.  Still on farxiga and trulicity at baseline.   He is on lower carb diet.  D/w pt about diet and exercise.  He is walking >10K steps at work.   Recheck periodically.  He agrees.

## 2021-03-24 ENCOUNTER — Other Ambulatory Visit: Payer: Self-pay | Admitting: Family Medicine

## 2021-03-24 ENCOUNTER — Other Ambulatory Visit: Payer: Self-pay | Admitting: Endocrinology

## 2021-03-28 ENCOUNTER — Other Ambulatory Visit: Payer: Self-pay | Admitting: Family Medicine

## 2021-03-28 NOTE — Telephone Encounter (Signed)
  Encourage patient to contact the pharmacy for refills or they can request refills through [place]:  Please schedule appointment if longer than 1 year  NEXT APPOINTMENT DATE:  MEDICATION:simvastatin (ZOCOR) 40 MG tablet Blood Glucose Monitoring Suppl (CONTOUR BLOOD GLUCOSE SYSTEM) w/Device  insulin glargine (LANTUS SOLOSTAR) 100 UNIT/ML Solostar   Is the patient out of medication?   PHARMACY[place] [place]    Let patient know to contact pharmacy at the end of the day to make sure medication is ready.  Please notify patient to allow 48-72 hours to process  CLINICAL FILLS OUT ALL BELOW:   LAST REFILL:  QTY:  REFILL DATE:    OTHER COMMENTS:    Okay for refill?  Please advise

## 2021-04-04 ENCOUNTER — Ambulatory Visit: Payer: No Typology Code available for payment source | Admitting: Family Medicine

## 2021-04-04 ENCOUNTER — Other Ambulatory Visit: Payer: Self-pay

## 2021-04-04 ENCOUNTER — Encounter: Payer: Self-pay | Admitting: Family Medicine

## 2021-04-04 VITALS — BP 120/82 | HR 95 | Temp 97.8°F | Ht 66.0 in | Wt 208.0 lb

## 2021-04-04 DIAGNOSIS — E119 Type 2 diabetes mellitus without complications: Secondary | ICD-10-CM | POA: Diagnosis not present

## 2021-04-04 DIAGNOSIS — Z23 Encounter for immunization: Secondary | ICD-10-CM | POA: Diagnosis not present

## 2021-04-04 LAB — POCT GLYCOSYLATED HEMOGLOBIN (HGB A1C): Hemoglobin A1C: 8.2 % — AB (ref 4.0–5.6)

## 2021-04-04 MED ORDER — LANTUS SOLOSTAR 100 UNIT/ML ~~LOC~~ SOPN: PEN_INJECTOR | SUBCUTANEOUS | Status: DC

## 2021-04-04 NOTE — Patient Instructions (Addendum)
Take care.  Glad to see you. Recheck A1c at a visit in about 3-4 months.  Add 1 unit of insulin daily if needed, if sugar >150.

## 2021-04-04 NOTE — Progress Notes (Signed)
This visit occurred during the SARS-CoV-2 public health emergency.  Safety protocols were in place, including screening questions prior to the visit, additional usage of staff PPE, and extensive cleaning of exam room while observing appropriate contact time as indicated for disinfecting solutions.  Diabetes:  Using medications without difficulties: yes Hypoglycemic episodes: only if prolonged fasting relative to activity, cautions d/w pt.  This was very rare.   Hyperglycemic episodes: no Feet problems: no Blood Sugars averaging: down to 120s, occ up to 150s.   eye exam within last year: due, d/w pt.   A1c done at [facility]. D/w pt.  D/w pt about diet and exercise.  He is walking more.    Meds, vitals, and allergies reviewed.  ROS: Per HPI unless specifically indicated in ROS section   GEN: nad, alert and oriented HEENT: ncat NECK: supple w/o LA CV: rrr. PULM: ctab, no inc wob ABD: soft, +bs EXT: no edema SKIN: well perfused.   Diabetic foot exam: Normal inspection No skin breakdown B 1st toe calluses  Normal DP pulses Normal sensation to light touch and monofilament Nails normal

## 2021-04-05 NOTE — Assessment & Plan Note (Addendum)
eye exam within last year: due, d/w pt.   A1c done at [facility]. D/w pt.  D/w pt about diet and exercise.  He is walking more.   Recheck A1c at a visit in about 3-4 months.  Add 1 unit of insulin daily if needed, if sugar >150.   Continue Trulicity Lantus and [place]

## 2021-05-12 ENCOUNTER — Telehealth: Payer: Self-pay | Admitting: Family Medicine

## 2021-05-12 MED ORDER — LANTUS SOLOSTAR 100 UNIT/ML ~~LOC~~ SOPN: PEN_INJECTOR | SUBCUTANEOUS | 2 refills | Status: DC

## 2021-05-12 NOTE — Telephone Encounter (Signed)
  Encourage patient to contact the pharmacy for refills or they can request refills through [place]:  Please schedule appointment if longer than 1 year  NEXT APPOINTMENT DATE:08/07/21  MEDICATION:insulin glargine (LANTUS SOLOSTAR) 100 UNIT/ML Solostar Pen  Is the patient out of medication? YES  PHARMACY:[facility] [place] [address]  Let patient know to contact pharmacy at the end of the day to make sure medication is ready.  Please notify patient to allow 48-72 hours to process  CLINICAL FILLS OUT ALL BELOW:   LAST REFILL:  QTY:  REFILL DATE:    OTHER COMMENTS:    Okay for refill?  Please advise

## 2021-05-12 NOTE — Telephone Encounter (Signed)
Refill sent in

## 2021-06-15 ENCOUNTER — Other Ambulatory Visit: Payer: Self-pay | Admitting: Endocrinology

## 2021-06-21 ENCOUNTER — Telehealth: Payer: Self-pay | Admitting: Family Medicine

## 2021-06-21 NOTE — Telephone Encounter (Signed)
  Encourage patient to contact the pharmacy for refills or they can request refills through [place]:  Please schedule appointment if longer than 1 year  NEXT APPOINTMENT DATE:  MEDICATION:Dulaglutide (TRULICITY) 4.5 TJ/4.0ZL SOPN   Is the patient out of medication?   PHARMACY[facility] [place]   Let patient know to contact pharmacy at the end of the day to make sure medication is ready.  Please notify patient to allow 48-72 hours to process  CLINICAL FILLS OUT ALL BELOW:   LAST REFILL:  QTY:  REFILL DATE:    OTHER COMMENTS:    Okay for refill?  Please advise

## 2021-06-22 MED ORDER — TRULICITY 4.5 MG/0.5ML ~~LOC~~ SOAJ: 4.5000 mg | SUBCUTANEOUS | 3 refills | Status: DC

## 2021-06-22 NOTE — Telephone Encounter (Signed)
Rx has been sent to patient's pharmacy. 

## 2021-08-07 ENCOUNTER — Encounter: Payer: Self-pay | Admitting: Family Medicine

## 2021-08-07 ENCOUNTER — Ambulatory Visit (INDEPENDENT_AMBULATORY_CARE_PROVIDER_SITE_OTHER): Payer: PRIVATE HEALTH INSURANCE | Admitting: Family Medicine

## 2021-08-07 ENCOUNTER — Other Ambulatory Visit: Payer: Self-pay

## 2021-08-07 VITALS — BP 118/80 | HR 84 | Temp 98.3°F | Ht 66.0 in | Wt 209.0 lb

## 2021-08-07 DIAGNOSIS — E119 Type 2 diabetes mellitus without complications: Secondary | ICD-10-CM

## 2021-08-07 LAB — CBC WITH DIFFERENTIAL/PLATELET
Basophils Absolute: 0 10*3/uL (ref 0.0–0.1)
Basophils Relative: 0.7 % (ref 0.0–3.0)
Eosinophils Absolute: 0.1 10*3/uL (ref 0.0–0.7)
Eosinophils Relative: 2.1 % (ref 0.0–5.0)
HCT: 44.9 % (ref 39.0–52.0)
Hemoglobin: 15.1 g/dL (ref 13.0–17.0)
Lymphocytes Relative: 33.9 % (ref 12.0–46.0)
Lymphs Abs: 2.1 10*3/uL (ref 0.7–4.0)
MCHC: 33.6 g/dL (ref 30.0–36.0)
MCV: 88.7 fl (ref 78.0–100.0)
Monocytes Absolute: 0.3 10*3/uL (ref 0.1–1.0)
Monocytes Relative: 5.5 % (ref 3.0–12.0)
Neutro Abs: 3.5 10*3/uL (ref 1.4–7.7)
Neutrophils Relative %: 57.8 % (ref 43.0–77.0)
Platelets: 229 10*3/uL (ref 150.0–400.0)
RBC: 5.06 Mil/uL (ref 4.22–5.81)
RDW: 13.1 % (ref 11.5–15.5)
WBC: 6.1 10*3/uL (ref 4.0–10.5)

## 2021-08-07 LAB — LIPID PANEL
Cholesterol: 167 mg/dL (ref 0–200)
HDL: 38.7 mg/dL — ABNORMAL LOW (ref >39.00)
NonHDL: 128.05
Total CHOL/HDL Ratio: 4
Triglycerides: 318 mg/dL — ABNORMAL HIGH (ref 0.0–149.0)
VLDL: 63.6 mg/dL — ABNORMAL HIGH (ref 0.0–40.0)

## 2021-08-07 LAB — COMPREHENSIVE METABOLIC PANEL
ALT: 22 U/L (ref 0–53)
AST: 15 U/L (ref 0–37)
Albumin: 4.3 g/dL (ref 3.5–5.2)
Alkaline Phosphatase: 43 U/L (ref 39–117)
BUN: 11 mg/dL (ref 6–23)
CO2: 24 mEq/L (ref 19–32)
Calcium: 9.2 mg/dL (ref 8.4–10.5)
Chloride: 106 mEq/L (ref 96–112)
Creatinine, Ser: 0.95 mg/dL (ref 0.40–1.50)
GFR: 94.97 mL/min (ref >60.00)
Glucose, Bld: 186 mg/dL — ABNORMAL HIGH (ref 70–99)
Potassium: 4 mEq/L (ref 3.5–5.1)
Sodium: 141 mEq/L (ref 135–145)
Total Bilirubin: 0.5 mg/dL (ref 0.2–1.2)
Total Protein: 6.9 g/dL (ref 6.0–8.3)

## 2021-08-07 LAB — MICROALBUMIN / CREATININE URINE RATIO
Creatinine,U: 83 mg/dL
Microalb Creat Ratio: 0.8 mg/g (ref 0.0–30.0)
Microalb, Ur: <0.7 mg/dL (ref 0.0–1.9)

## 2021-08-07 LAB — LDL CHOLESTEROL, DIRECT: Direct LDL: 85 mg/dL

## 2021-08-07 LAB — HEMOGLOBIN A1C: Hgb A1c MFr Bld: 8.7 % — ABNORMAL HIGH (ref 4.6–6.5)

## 2021-08-07 LAB — TSH: TSH: 2.06 u[IU]/mL (ref 0.35–5.50)

## 2021-08-07 MED ORDER — LANTUS SOLOSTAR 100 UNIT/ML ~~LOC~~ SOPN: PEN_INJECTOR | SUBCUTANEOUS | 2 refills | Status: DC

## 2021-08-07 NOTE — Patient Instructions (Addendum)
Go to the lab on the way out.   If you have mychart we'll likely use that to update you.    Take care.  Glad to see you.  Plan on recheck in about 4-5 months with A1c at the visit.  Thanks for your effort.

## 2021-08-07 NOTE — Progress Notes (Signed)
This visit occurred during the SARS-CoV-2 public health emergency.  Safety protocols were in place, including screening questions prior to the visit, additional usage of staff PPE, and extensive cleaning of exam room while observing appropriate contact time as indicated for disinfecting solutions.  Diabetes:  Using medications without difficulties: yes Hypoglycemic episodes: cautions d/w pt.  He is tapering insulin, see below.   Hyperglycemic episodes: no Feet problems:no Blood Sugars averaging: ~150 eye exam within last year: due this year, d/w pt.   On farxiga, trulicity, now down to 30 units a week.  He is working on diet with nutrasystem and that helped his sugar.  He is okay tapering his insulin by 1 unit a day.  See orders. He is walking more.   His daughter is in the ninth grade and made the basketball team at [employer].  He is understandably proud of her.  Discussed.  Meds, vitals, and allergies reviewed.   ROS: Per HPI unless specifically indicated in ROS section   GEN: nad, alert and oriented HEENT: ncat NECK: supple w/o LA CV: rrr. PULM: ctab, no inc wob ABD: soft, +bs EXT: no edema SKIN: well perfused.    Diabetic foot exam: Normal inspection No skin breakdown Small B 1 toe calluses w/o ulceration Normal DP pulses Normal sensation to light touch and monofilament Nails normal

## 2021-08-09 NOTE — Assessment & Plan Note (Signed)
Continue work on diet and exercise. Plan on recheck in about 4-5 months with A1c at the visit.  He has been able to taper his insulin in the meantime.  Continue Farxiga Trulicity and insulin.

## 2021-08-14 ENCOUNTER — Telehealth: Payer: Self-pay

## 2021-08-14 NOTE — Telephone Encounter (Signed)
Spoke with patient and clarified when patient last picked up his med and what the situation was. Patient stated that his pharmacy was having a hard time getting his prescription in and to him; no insurance issues were going on just shipping issues. He last picked up his rx last week and is good as long as the pharmacy is able to continue to get it in. I advised patient to let us know next time if this continues to happen that maybe we can send it elsewhere that may have it. Patient states with his insurance he has to use the hospitals pharmacy or they wont be for it. Advised patient to keep [place] updated.

## 2021-08-14 NOTE — Telephone Encounter (Signed)
Not sure whats happening here or when he picked up the last rx so I will call patient today and see whats going on. I did a PA for this on 07/28/21 and it was approved until 07/28/22.

## 2021-08-14 NOTE — Telephone Encounter (Signed)
I called pt to give pt his lab results and pt voiced understanding and will keep a log of BS for the next wk as instructed and pt will cb next wk with BS readings. Pt wanted Dr Damita Dunnings and Janett Billow CMA to know that he is having problems getting Trulicity from [facility]. Pt said he did not have Trulicity  the entire month of December 2022. Pt said his FBS in Dec was averaging 170's. Pt said he just got one months refill on Trulicity from the pharmacy but wanted Dr Damita Dunnings and Janett Billow CMA to be aware in case it happens again. Pt will cb if has problems getting Trulicity in the future. Sending note to Dr Damita Dunnings and Janett Billow CMA.

## 2021-08-15 NOTE — Telephone Encounter (Signed)
Noted. Thanks.

## 2021-08-24 ENCOUNTER — Telehealth: Payer: Self-pay | Admitting: Family Medicine

## 2021-08-24 NOTE — Telephone Encounter (Signed)
Spoke with patient about message below and patient verbalized understanding. Patient stated sugars are still doing well and will update [place] as needed.

## 2021-08-24 NOTE — Telephone Encounter (Signed)
Please check with patient.  I thank him for his effort with diet and checking his sugar.  His numbers look good and given those readings I don't think it makes much sense to change his meds.  If he has made significant diet improvement (that led to the improved sugars) then I would continue as is.  I think we should recheck his sugar in May as planned.  I would check sugars at home episodically but if they are staying below 150 usually then I wouldn't change meds.  Please update me as needed.  Thanks.

## 2021-08-31 ENCOUNTER — Other Ambulatory Visit: Payer: Self-pay | Admitting: Family Medicine

## 2021-09-27 ENCOUNTER — Other Ambulatory Visit (HOSPITAL_COMMUNITY): Payer: Self-pay

## 2021-09-27 ENCOUNTER — Telehealth: Payer: Self-pay

## 2021-09-27 NOTE — Telephone Encounter (Signed)
Patient Advocate Encounter ?  ?Received notification from [facility] that prior authorization for Farxiga '5mg'$  tabs is required by his/her insurance [employer] ?  ?PA submitted on 09/27/21 ? ?Key#: [id number] ? ?Status is pending ?   ?[facility] will continue to follow: ? ?Patient Advocate ?Fax: [phone]  ?

## 2021-09-27 NOTE — Telephone Encounter (Signed)
Patient Advocate Encounter ? ?Prior Authorization for Farxiga '5mg'$  tabs has been approved.   ? ?PA# [id number] ? ?Effective dates: 09/27/21 through 09/28/22 ? ?Refill too soon. ? ?Patient Advocate ?Fax: [phone]  ?

## 2021-12-04 ENCOUNTER — Encounter: Payer: Self-pay | Admitting: Family Medicine

## 2021-12-04 ENCOUNTER — Ambulatory Visit: Payer: PRIVATE HEALTH INSURANCE | Admitting: Family Medicine

## 2021-12-04 VITALS — BP 120/84 | HR 94 | Temp 97.9°F | Ht 66.0 in | Wt 202.0 lb

## 2021-12-04 DIAGNOSIS — E119 Type 2 diabetes mellitus without complications: Secondary | ICD-10-CM

## 2021-12-04 LAB — POCT GLYCOSYLATED HEMOGLOBIN (HGB A1C): Hemoglobin A1C: 7.2 % — AB (ref 4.0–5.6)

## 2021-12-04 MED ORDER — CONTOUR NEXT TEST VI STRP: ORAL_STRIP | 3 refills | Status: AC

## 2021-12-04 MED ORDER — LANTUS SOLOSTAR 100 UNIT/ML ~~LOC~~ SOPN: PEN_INJECTOR | SUBCUTANEOUS | 2 refills | Status: DC

## 2021-12-04 NOTE — Patient Instructions (Addendum)
A1c is better.  Thanks for your effort.  Please call about an eye exam.   Take care.  Glad to see you. Plan on recheck in about 4 months.  A1c at the visit.

## 2021-12-04 NOTE — Progress Notes (Unsigned)
Diabetes:  Using medications without difficulties: yes Hypoglycemic episodes:no Hyperglycemic episodes:no Feet problems: no Blood Sugars averaging: 90 day average has been ~149 for all sugars, not just fasting.   eye exam within last year: due, d/w pt.   A1c improved to 7.2, d/w pt.   Still on trulicity, farxiga, and 30-35 units insulin.   Down 7 lbs, working on diet in the meantime.  D/w pt.  I thanked him for his effort.    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  He is usually on 1st shift.  Cautions re: shift change and sugars/meds.    He had dermatology f/u pending.    His daughter made varsity softball as a Museum/gallery exhibitions officer, [place].    Meds, vitals, and allergies reviewed.  ROS: Per HPI unless specifically indicated in ROS section   GEN: nad, alert and oriented HEENT: ncat NECK: supple w/o LA CV: rrr. PULM: ctab, no inc wob EXT: no edema SKIN: no acute rash  Diabetic foot exam: Normal inspection No skin breakdown No calluses  Normal DP pulses Normal sensation to light touch and monofilament Nails normal

## 2021-12-06 NOTE — Assessment & Plan Note (Signed)
A1c improved to 7.2, d/w pt.   Still on trulicity, farxiga, and 30-35 units insulin.   Would continue all as is.   Down 7 lbs, working on diet in the meantime.  D/w pt.  I thanked him for his effort.   Plan on recheck in about 4 months.  A1c at the visit. +

## 2022-01-12 LAB — HM DIABETES EYE EXAM

## 2022-02-27 ENCOUNTER — Other Ambulatory Visit: Payer: Self-pay | Admitting: Family Medicine

## 2022-04-06 ENCOUNTER — Encounter: Payer: Self-pay | Admitting: Family Medicine

## 2022-04-06 ENCOUNTER — Ambulatory Visit: Payer: PRIVATE HEALTH INSURANCE | Admitting: Family Medicine

## 2022-04-06 VITALS — BP 116/80 | HR 64 | Temp 97.6°F | Ht 66.0 in | Wt 201.0 lb

## 2022-04-06 DIAGNOSIS — Z23 Encounter for immunization: Secondary | ICD-10-CM

## 2022-04-06 DIAGNOSIS — E119 Type 2 diabetes mellitus without complications: Secondary | ICD-10-CM | POA: Diagnosis not present

## 2022-04-06 LAB — POCT GLYCOSYLATED HEMOGLOBIN (HGB A1C): Hemoglobin A1C: 6.9 % — AB (ref 4.0–5.6)

## 2022-04-06 MED ORDER — LANTUS SOLOSTAR 100 UNIT/ML ~~LOC~~ SOPN: PEN_INJECTOR | SUBCUTANEOUS | 2 refills | Status: DC

## 2022-04-06 NOTE — Patient Instructions (Addendum)
Physical with labs ahead of time ~07/2022.   Thanks for your effort.  Update me as needed. Taper insulin if you have sugars below 100.  Take care.  Glad to see you.

## 2022-04-06 NOTE — Assessment & Plan Note (Signed)
Continue meds as is for now.   Current Outpatient Medications on File Prior to Visit  Medication Sig Dispense Refill  . Blood Glucose Monitoring Suppl (CONTOUR BLOOD GLUCOSE SYSTEM) w/Device KIT Use as instructed to check blood sugar 3 times daily.  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 1 kit 0  . Dulaglutide (TRULICITY) 4.5 VO/3.5KK SOPN Inject 4.5 mg as directed once a week. 6 mL 3  . FARXIGA 5 MG TABS tablet Take 1 tablet (5 mg total) by mouth daily before breakfast. 90 tablet 3  . fish oil-omega-3 fatty acids 1000 MG capsule Take 1 g by mouth 4 (four) times daily.    . fluticasone (FLONASE) 50 MCG/ACT nasal spray Place 2 sprays into both nostrils daily. 16 g 6  . Garlic 938 MG TABS Take 1 tablet by mouth daily.    Marland Kitchen glucose blood (CONTOUR NEXT TEST) test strip USE 1 STRIP TO TEST YOUR BLOOD SUGAR UP TO THREE TIMES A DAY. 300 each 3  . Insulin Pen Needle (B-D ULTRAFINE III SHORT PEN) 31G X 8 MM MISC Use daily with insulin pen    . loratadine (CLARITIN) 10 MG tablet Take 10 mg by mouth as needed.    . Microlet Lancets MISC Use to check blood sugar three times daily 300 each 3  . Multiple Vitamin (MULTIVITAMIN) tablet Take 1 tablet by mouth daily.    Marland Kitchen omeprazole (PRILOSEC OTC) 20 MG tablet Take 1 tablet (20 mg total) by mouth daily as needed. 90 tablet 3  . simvastatin (ZOCOR) 40 MG tablet Take 1 tablet (40 mg total) by mouth at bedtime. 90 tablet 3  . triamcinolone cream (KENALOG) 0.1 % Apply 1 application topically 2 (two) times daily. 454 g 1   No current facility-administered medications on file prior to visit.   D/w pt.  Continue work on diet and exercise.  Can taper insulin if sugar is below 100.  Recheck in a few months.

## 2022-04-06 NOTE — Progress Notes (Signed)
Diabetes:  Using medications without difficulties: Hypoglycemic episodes: he had some lower sugars and cut back on insulin.  Lowest was in the 80s.   Hyperglycemic episodes:no Feet problems: no Blood Sugars averaging:100-140 usually in the AMs.   eye exam within last year: yes A1c d/w pt at [facility].  Improved to 6.9.   He has been able to cut insulin back to 30 units per day.   He is walking a lot at work.    Meds, vitals, and allergies reviewed.  ROS: Per HPI unless specifically indicated in ROS section   GEN: nad, alert and oriented HEENT: ncat NECK: supple w/o LA CV: rrr. PULM: ctab, no inc wob ABD: soft, +bs EXT: no edema SKIN: well perfused.   Diabetic foot exam: Normal inspection No skin breakdown No calluses  Normal DP pulses Normal sensation to light touch and monofilament Nails normal

## 2022-04-17 ENCOUNTER — Telehealth: Payer: Self-pay | Admitting: Family Medicine

## 2022-04-17 MED ORDER — DAPAGLIFLOZIN PROPANEDIOL 5 MG PO TABS: 5.0000 mg | ORAL_TABLET | Freq: Every day | ORAL | 3 refills | Status: DC

## 2022-04-17 NOTE — Telephone Encounter (Signed)
Erx sent

## 2022-04-17 NOTE — Telephone Encounter (Signed)
Caller Name: ismaeel arvelo  Call back phone #: [phone]  MEDICATION(S):  FARXIGA 5 MG TABS tablet  Days of Med Remaining: 0  Has the patient contacted their pharmacy (YES/NO)? NO What did pharmacy advise?   Preferred Pharmacy:  [facility] [place]   ~~~Please advise patient/caregiver to allow 2-3 business days to process RX refills.

## 2022-05-24 ENCOUNTER — Telehealth: Payer: Self-pay | Admitting: Family Medicine

## 2022-05-24 MED ORDER — OMEPRAZOLE MAGNESIUM 20 MG PO TBEC: 20.0000 mg | DELAYED_RELEASE_TABLET | Freq: Every day | ORAL | 3 refills | Status: DC | PRN

## 2022-05-24 NOTE — Telephone Encounter (Signed)
Caller Name: Ether Call back phone #: [phone]  MEDICATION(S):  omeprazole (PRILOSEC OTC) 20 MG tablet   Days of Med Remaining: 0  Has the patient contacted their pharmacy (YES/NO)? NO What did pharmacy advise?   Preferred Pharmacy:   [facility] [facility]     ~~~Please advise patient/caregiver to allow 2-3 business days to process RX refills.   Pt requested 90 day supply due to being able to get meds cheaper through prescription vs over the counter.

## 2022-05-24 NOTE — Telephone Encounter (Signed)
ERX sent.  

## 2022-06-13 ENCOUNTER — Other Ambulatory Visit: Payer: Self-pay | Admitting: Family Medicine

## 2022-07-15 ENCOUNTER — Other Ambulatory Visit: Payer: Self-pay | Admitting: Family Medicine

## 2022-07-15 DIAGNOSIS — E119 Type 2 diabetes mellitus without complications: Secondary | ICD-10-CM

## 2022-07-17 ENCOUNTER — Other Ambulatory Visit: Payer: Self-pay | Admitting: Family Medicine

## 2022-07-17 NOTE — Telephone Encounter (Signed)
Pt called to request RX refill   insulin glargine (LANTUS SOLOSTAR) 100 UNIT/ML Solostar Pen

## 2022-07-19 ENCOUNTER — Other Ambulatory Visit: Payer: Self-pay | Admitting: Family Medicine

## 2022-07-20 ENCOUNTER — Other Ambulatory Visit (INDEPENDENT_AMBULATORY_CARE_PROVIDER_SITE_OTHER): Payer: PRIVATE HEALTH INSURANCE

## 2022-07-20 DIAGNOSIS — E119 Type 2 diabetes mellitus without complications: Secondary | ICD-10-CM

## 2022-07-20 LAB — COMPREHENSIVE METABOLIC PANEL
ALT: 24 U/L (ref 0–53)
AST: 16 U/L (ref 0–37)
Albumin: 4.4 g/dL (ref 3.5–5.2)
Alkaline Phosphatase: 55 U/L (ref 39–117)
BUN: 14 mg/dL (ref 6–23)
CO2: 28 mEq/L (ref 19–32)
Calcium: 9.4 mg/dL (ref 8.4–10.5)
Chloride: 102 mEq/L (ref 96–112)
Creatinine, Ser: 0.84 mg/dL (ref 0.40–1.50)
GFR: 102.78 mL/min (ref >60.00)
Glucose, Bld: 132 mg/dL — ABNORMAL HIGH (ref 70–99)
Potassium: 4.3 mEq/L (ref 3.5–5.1)
Sodium: 139 mEq/L (ref 135–145)
Total Bilirubin: 0.5 mg/dL (ref 0.2–1.2)
Total Protein: 6.9 g/dL (ref 6.0–8.3)

## 2022-07-20 LAB — MICROALBUMIN / CREATININE URINE RATIO
Creatinine,U: 141.1 mg/dL
Microalb Creat Ratio: 0.5 mg/g (ref 0.0–30.0)
Microalb, Ur: <0.7 mg/dL (ref 0.0–1.9)

## 2022-07-20 LAB — LIPID PANEL
Cholesterol: 214 mg/dL — ABNORMAL HIGH (ref 0–200)
HDL: 37.7 mg/dL — ABNORMAL LOW (ref >39.00)
Total CHOL/HDL Ratio: 6
Triglycerides: 612 mg/dL — ABNORMAL HIGH (ref 0.0–149.0)

## 2022-07-20 LAB — HEMOGLOBIN A1C: Hgb A1c MFr Bld: 7.9 % — ABNORMAL HIGH (ref 4.6–6.5)

## 2022-07-20 LAB — LDL CHOLESTEROL, DIRECT: Direct LDL: 81 mg/dL

## 2022-07-27 ENCOUNTER — Ambulatory Visit (INDEPENDENT_AMBULATORY_CARE_PROVIDER_SITE_OTHER): Payer: PRIVATE HEALTH INSURANCE | Admitting: Family Medicine

## 2022-07-27 ENCOUNTER — Encounter: Payer: Self-pay | Admitting: Family Medicine

## 2022-07-27 VITALS — BP 118/78 | HR 84 | Temp 97.2°F | Ht 66.0 in | Wt 208.0 lb

## 2022-07-27 DIAGNOSIS — Z7189 Other specified counseling: Secondary | ICD-10-CM

## 2022-07-27 DIAGNOSIS — E782 Mixed hyperlipidemia: Secondary | ICD-10-CM

## 2022-07-27 DIAGNOSIS — E119 Type 2 diabetes mellitus without complications: Secondary | ICD-10-CM

## 2022-07-27 DIAGNOSIS — Z0000 Encounter for general adult medical examination without abnormal findings: Secondary | ICD-10-CM

## 2022-07-27 DIAGNOSIS — J069 Acute upper respiratory infection, unspecified: Secondary | ICD-10-CM

## 2022-07-27 MED ORDER — LANTUS SOLOSTAR 100 UNIT/ML ~~LOC~~ SOPN: PEN_INJECTOR | SUBCUTANEOUS | 2 refills | Status: DC

## 2022-07-27 MED ORDER — AMOXICILLIN-POT CLAVULANATE 875-125 MG PO TABS: 1.0000 | ORAL_TABLET | Freq: Two times a day (BID) | ORAL | 0 refills | Status: DC

## 2022-07-27 NOTE — Progress Notes (Unsigned)
CPE- See plan.  Routine anticipatory guidance given to patient.  See health maintenance.  The possibility exists that previously documented standard health maintenance information may have been brought forward from a previous encounter into this note.  If needed, that same information has been updated to reflect the current situation based on today's encounter.    Tetanus 2020 Flu 2023 PNA 2015 Shingles not due Covid prev done.  prostate cancer screening not due D/w patient [id number] for colon cancer screening, including IFOB vs. Colonoscopy vs cologuard.  Risks and benefits of both were discussed and patient voiced understanding.  Pt elects to consider.  Living will d/w pt.  Wife designated if patient were incapacitated.   D&E d/w pt.    Diabetes:  Using medications without difficulties: yes Hypoglycemic episodes:no Hyperglycemic episodes:no Feet problems: no Blood Sugars averaging: ~180 eye exam within last year: yes Insulin recently at 50 units.  He had inc'd insulin recently.    Elevated Cholesterol: Using medications without problems: yes Muscle aches: no Diet compliance:d/w pt.  Exercise: d/ wpt.   Labs d/w pt.   TG up with A1c, d/w pt.   Diet was off with the holidays.   He can work more on diet.  D/w pt.    Still seeing dermatology routinely.    URI sx.  Ear fullness, congestion.  No fevers. Cough.  Going on about 1 week.  White sputum.    PMH and SH reviewed  Meds, vitals, and allergies reviewed.   ROS: Per HPI.  Unless specifically indicated otherwise in HPI, the patient denies:  General: fever. Eyes: acute vision changes ENT: sore throat Cardiovascular: chest pain Respiratory: SOB GI: vomiting GU: dysuria Musculoskeletal: acute back pain Derm: acute rash Neuro: acute motor dysfunction Psych: worsening mood Endocrine: polydipsia Heme: bleeding Allergy: hayfever  GEN: nad, alert and oriented HEENT: mucous membranes moist, tm w/o erythema, nasal exam  w/o erythema, clear discharge noted,  OP with cobblestoning, R max sinus ttp. NECK: supple w/o LA CV: rrr.   PULM: ctab, no inc wob EXT: no edema SKIN: no acute rash  Diabetic foot exam: Normal inspection No skin breakdown No calluses  Normal DP pulses Normal sensation to light touch and monofilament Nails normal

## 2022-07-27 NOTE — Patient Instructions (Addendum)
Recheck in about 3 months.  A1c at the visit. Okay to increase insulin by 1 unit at a time if needed.  Take care.  Glad to see you. If not better soon, then start augmentin.  Update me as needed.

## 2022-07-29 DIAGNOSIS — J069 Acute upper respiratory infection, unspecified: Secondary | ICD-10-CM | POA: Insufficient documentation

## 2022-07-29 NOTE — Assessment & Plan Note (Signed)
Continue simvastatin. Labs d/w pt.   TG up with A1c, d/w pt.   Diet was off with the holidays.   He can work more on diet.  D/w pt.

## 2022-07-29 NOTE — Assessment & Plan Note (Signed)
Living will d/w pt.  Wife designated if patient were incapacitated.   ?

## 2022-07-29 NOTE — Assessment & Plan Note (Signed)
Tetanus 2020 Flu 2023 PNA 2015 Shingles not due Covid prev done.  prostate cancer screening not due D/w patient [id number] for colon cancer screening, including IFOB vs. Colonoscopy vs cologuard.  Risks and benefits of both were discussed and patient voiced understanding.  Pt elects to consider.  Living will d/w pt.  Wife designated if patient were incapacitated.   D&E d/w pt.

## 2022-07-29 NOTE — Assessment & Plan Note (Signed)
Okay for outpatient follow-up.  Supportive care.  If more sinus pain in the meantime then start Augmentin.

## 2022-07-29 NOTE — Assessment & Plan Note (Signed)
Continue work on diet and exercise. Recheck in about 3 months.  A1c at the visit. Okay to increase insulin by 1 unit at a time if needed.

## 2022-09-18 ENCOUNTER — Ambulatory Visit: Payer: PRIVATE HEALTH INSURANCE | Admitting: Family Medicine

## 2022-09-18 ENCOUNTER — Encounter: Payer: Self-pay | Admitting: Family Medicine

## 2022-09-18 VITALS — BP 122/78 | HR 102 | Temp 97.5°F | Ht 66.0 in | Wt 212.0 lb

## 2022-09-18 DIAGNOSIS — M25519 Pain in unspecified shoulder: Secondary | ICD-10-CM

## 2022-09-18 DIAGNOSIS — R0683 Snoring: Secondary | ICD-10-CM

## 2022-09-18 MED ORDER — MELOXICAM 15 MG PO TABS: 15.0000 mg | ORAL_TABLET | Freq: Every day | ORAL | 0 refills | Status: DC

## 2022-09-18 MED ORDER — LANTUS SOLOSTAR 100 UNIT/ML ~~LOC~~ SOPN: PEN_INJECTOR | SUBCUTANEOUS | 2 refills | Status: DC

## 2022-09-18 NOTE — Progress Notes (Signed)
R arm and shoulder pain; there has been no known injury to arm. Patient states it started about the beginning of February and has been taking ibuprofen for the pain. Patient states today it is feeling better and has not taken any medication today.   Previous pain at the R scapula.  Pain can radiation from the R upper abdomen up to the R shoulder and then down to the R elbow.  Pain with overhead throwing motion now, he had to change his arm angle due to elbow pain over the last few years.    Taking 50 units insulin daily.   He is snoring persistently.  D/w pt about pulmonary eval for OSA.  Cautions d/w pt, ie driving.    Meds, vitals, and allergies reviewed.   ROS: Per HPI unless specifically indicated in ROS section

## 2022-09-18 NOTE — Patient Instructions (Addendum)
Let me know if you don't get a call about seeing pulmonary.    I would use a tennis elbow strap and ice your elbow as needed for 5 minutes.    Try the shoulder exercises.    Update me as needed.  Take care.  Glad to see you.

## 2022-09-19 NOTE — Assessment & Plan Note (Signed)
Refer to pulmonary for sleep apnea testing.

## 2022-09-19 NOTE — Assessment & Plan Note (Signed)
Likely combination of pectoral pain, rotator cuff irritation, and also tennis elbow.  Relative rest with pectoral pain, home exercise program for rotator cuff strain given to patient and discussed/demonstrated, use a tennis elbow strap and ice as needed for elbow discomfort.  He agrees to plan.  He will update me as needed.

## 2022-09-28 ENCOUNTER — Ambulatory Visit: Payer: PRIVATE HEALTH INSURANCE | Admitting: Primary Care

## 2022-09-28 ENCOUNTER — Encounter: Payer: Self-pay | Admitting: Primary Care

## 2022-09-28 VITALS — BP 112/78 | HR 91 | Ht 66.25 in | Wt 215.2 lb

## 2022-09-28 DIAGNOSIS — R0683 Snoring: Secondary | ICD-10-CM | POA: Diagnosis not present

## 2022-09-28 NOTE — Patient Instructions (Signed)
  Sleep apnea is defined as period of 10 seconds or longer when you stop breathing at night. This can happen multiple times a night. Dx sleep apnea is when this occurs more than 5 times an hour.    Mild OSA 5-15 apneic events an hour Moderate OSA 15-30 apneic events an hour Severe OSA > 30 apneic events an hour   Untreated sleep apnea puts you at higher risk for cardiac arrhythmias, pulmonary HTN, stroke and diabetes  Treatment options include weight loss, side sleeping position, oral appliance, CPAP therapy or referral to ENT for possible surgical options    Recommendations: Focus on side sleeping position or elevate head with wedge pillow 30 degrees Work on weight loss efforts if able  Do not drive if experiencing excessive daytime sleepiness of fatigue    Orders: Home sleep study re: loud snoring (ordered)   Follow-up: Please call to schedule follow-up 1-2 weeks after completing home sleep study to review results and treatment if needed (can be virtual) 
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
## 2022-09-28 NOTE — Assessment & Plan Note (Addendum)
-   Patient has symptoms of snoring, witnessed apnea, daytime sleepiness. He had polysomnography back in 2019 that was negative for sleep disordered breathing but showed nocturnal hypoxia. He was started on nocturnal oxygen but is no longer wearing. His weight is up 15 lbs. Continues to have symptoms concerning for OSA, needs repeat sleep testing. Recommend getting HST as he reports not sleeping well during PSG. We reviewed risks of untreated sleep apnea including cardiac arrhthymias, stroke, pulmonary HTN and diabetes. We discussed treatment options including weight loss, oral appliance, CPAP or referral to ENT for possible surgical options. Encourage side sleeping positive and weight loss. FU 1-2 weeks after sleep study to review results and treatment options further.

## 2022-10-22 ENCOUNTER — Telehealth: Payer: Self-pay

## 2022-10-22 MED ORDER — LANTUS SOLOSTAR 100 UNIT/ML ~~LOC~~ SOPN: PEN_INJECTOR | SUBCUTANEOUS | 2 refills | Status: DC

## 2022-10-22 NOTE — Telephone Encounter (Signed)
Looks like rx was sent as no print so it did not send to pharmacy. Erx has been fixed and sent to patients pharmacy.

## 2022-10-22 NOTE — Telephone Encounter (Signed)
Patient called needs refill on Lantus. Looks like 30 day with 2 refills were sent in last month but he is saying the pharmacy does not have any refills.   Patient would like call back when sent   [facility] - [place], [place] - [address]

## 2022-10-26 ENCOUNTER — Encounter: Payer: Self-pay | Admitting: Family Medicine

## 2022-10-26 ENCOUNTER — Ambulatory Visit: Payer: PRIVATE HEALTH INSURANCE | Admitting: Family Medicine

## 2022-10-26 VITALS — BP 122/84 | HR 95 | Temp 98.1°F | Ht 66.25 in | Wt 211.0 lb

## 2022-10-26 DIAGNOSIS — E119 Type 2 diabetes mellitus without complications: Secondary | ICD-10-CM

## 2022-10-26 LAB — POCT GLYCOSYLATED HEMOGLOBIN (HGB A1C): Hemoglobin A1C: 7 % — AB (ref 4.0–5.6)

## 2022-10-26 NOTE — Progress Notes (Unsigned)
Diabetes:  Using medications without difficulties: yes Hypoglycemic episodes: one episode when he had missed a dose of his medication- down to 95- cautions d/w pt.   Hyperglycemic episodes: no Feet problems: no Blood Sugars averaging: ~150 eye exam within last year:yes A1c 7.  D/w pt at OV.  Farxiga, insulin, trulicity.   He is working on diet, walking.  D/w pt.    He hasn't heard about follow up home sleep test.  I asked him to check with pulmonary if he does not hear about that soon.  Shoulder pain is better.  Off meloxicam.    Daughter playing softball at [employer] [facility].    Had been taking claritin and mucinex due to allergy season.    Meds, vitals, and allergies reviewed.   ROS: Per HPI unless specifically indicated in ROS section   GEN: nad, alert and oriented HEENT: ncat NECK: supple w/o LA CV: rrr. PULM: ctab, no inc wob ABD: soft, +bs EXT: no edema SKIN: no acute rash  Diabetic foot exam: Normal inspection No skin breakdown No calluses  Normal DP pulses Normal sensation to light touch and monofilament Nails normal

## 2022-10-26 NOTE — Patient Instructions (Addendum)
Check with pulmonary if you don't hear about the sleep test.  Take care.  Glad to see you. Recheck in about 4 months.  A1c at the visit.

## 2022-10-28 NOTE — Assessment & Plan Note (Signed)
A1c 7.  D/w pt at OV.  Farxiga, insulin, trulicity.   He is working on diet, walking.  D/w pt.   No change in medications at this point.  Asked him to check with pulmonary about follow-up.  He will update me as needed.  Recheck periodically.

## 2022-11-23 ENCOUNTER — Ambulatory Visit (INDEPENDENT_AMBULATORY_CARE_PROVIDER_SITE_OTHER): Payer: PRIVATE HEALTH INSURANCE | Admitting: Primary Care

## 2022-11-23 DIAGNOSIS — G4733 Obstructive sleep apnea (adult) (pediatric): Secondary | ICD-10-CM | POA: Diagnosis not present

## 2022-11-23 DIAGNOSIS — R0683 Snoring: Secondary | ICD-10-CM

## 2022-12-05 NOTE — Progress Notes (Signed)
Please let patient know home sleep study showed mild sleep apnea, he had on average 11 apneas an hour. If symptomatic and wants to discuss treatment options further please set up visit, can be virtual

## 2022-12-25 ENCOUNTER — Telehealth: Payer: Self-pay | Admitting: Family Medicine

## 2022-12-25 NOTE — Telephone Encounter (Signed)
Patient called in stating that medication TRULICITY 4.5 MG/0.5ML SOPN  is on back order,and the monjuro is on back order as well.He would like to know is there a different alternative that he can have or what should he do? Please advise

## 2022-12-26 MED ORDER — SEMAGLUTIDE (2 MG/DOSE) 8 MG/3ML ~~LOC~~ SOPN: 2.0000 mg | PEN_INJECTOR | SUBCUTANEOUS | 3 refills | Status: DC

## 2022-12-26 NOTE — Telephone Encounter (Signed)
Noted. Thanks.  Will await update from patient.  

## 2022-12-26 NOTE — Addendum Note (Signed)
Addended by: Joaquim Nam on: 12/26/2022 11:01 PM   Modules accepted: Orders

## 2022-12-26 NOTE — Telephone Encounter (Signed)
If he can't find Trulicity or Mounjaro anywhere, as an alternative I know Ozempic 2 mg has been more readily available.

## 2022-12-26 NOTE — Telephone Encounter (Signed)
Gordon Hudson- has he checked for availability at other pharmacies?  Please let me know.    I am asking for pharmacy input here as well.  I thank all involved.

## 2022-12-26 NOTE — Telephone Encounter (Signed)
Spoke with patient and he has not called around to anywhere else for rxs. Stated that he will check around and see if anyone has them and let us know.

## 2022-12-26 NOTE — Telephone Encounter (Signed)
I sent the rx for ozempic.  Please see if he can get that filled.  Thanks.

## 2022-12-26 NOTE — Telephone Encounter (Signed)
Patient called in and stated that he has called all [facility] and [facility] regarding his Trulicity. He stated that they don't have any in a 20 mile radius. Thank you!

## 2022-12-26 NOTE — Addendum Note (Signed)
Addended by: Joaquim Nam on: 12/26/2022 09:17 PM   Modules accepted: Orders

## 2022-12-27 NOTE — Telephone Encounter (Signed)
Patient notified rx was sent 

## 2023-01-08 ENCOUNTER — Ambulatory Visit: Payer: PRIVATE HEALTH INSURANCE | Admitting: Primary Care

## 2023-01-08 ENCOUNTER — Encounter: Payer: Self-pay | Admitting: Primary Care

## 2023-01-08 VITALS — BP 124/76 | HR 87 | Temp 98.4°F | Ht 66.0 in | Wt 216.0 lb

## 2023-01-08 DIAGNOSIS — G4730 Sleep apnea, unspecified: Secondary | ICD-10-CM

## 2023-01-08 NOTE — Assessment & Plan Note (Addendum)
-   Home sleep study 11/23/22 showed mild OSA, average AHI 10.9 an hour with SpO2 low 79%.  Patient spent 30 minutes with O2 less than 88%.  We reviewed risks of untreated sleep apnea and treatment options.  Due to moderate hypoxemia recommending patient be started on PAP therapy, patient in agreement with plan.  DME order placed for auto CPAP 5 to 15 cm H2O with heated humidity and mask of choice.  Advised patient aim to wear CPAP nightly for 4 to 6 hours or longer.  Patient intends to work on weight loss and will also try wedge pillow.  Follow-up 6 to 8 weeks for CPAP compliance or sooner if needed.

## 2023-01-08 NOTE — Patient Instructions (Addendum)
Home sleep study showed mild obstructive sleep apnea with moderate oxygen desaturation You had an average of 10.9 apneic events an hour, low oxygen level of 79% You spent 30 minutes with an oxygen level less than 88%  Treatment options include conservatively managing with weight loss and focusing on side sleeping position versus starting CPAP  I would recommend CPAP therapy due to oxygen drops.  Once you have achieved a weight loss goal we can repeat sleep study and reassess CPAP need at that time  Aim to wear CPAP nightly for 4 to 6 hours or longer (compliance is 70% usage more than 4 hours a night )  Follow-up You will need to set up a follow-up visit with me 6 to 8 weeks after starting CPAP for compliance check/otherwise follow-up in 3 months  CPAP and BIPAP Information CPAP and BIPAP are methods that use air pressure to keep your airways open and to help you breathe well. CPAP and BIPAP use different amounts of pressure. Your health care provider will tell you whether CPAP or BIPAP would be more helpful for you. CPAP stands for "continuous positive airway pressure." With CPAP, the amount of pressure stays the same while you breathe in (inhale) and out (exhale). BIPAP stands for "bi-level positive airway pressure." With BIPAP, the amount of pressure will be higher when you inhale and lower when you exhale. This allows you to take larger breaths. CPAP or BIPAP may be used in the hospital, or your health care provider may want you to use it at home. You may need to have a sleep study before your health care provider can order a machine for you to use at home. What are the advantages? CPAP or BIPAP can be helpful if you have: Sleep apnea. Chronic obstructive pulmonary disease (COPD). Heart failure. Medical conditions that cause muscle weakness, including muscular dystrophy or amyotrophic lateral sclerosis (ALS). Other problems that cause breathing to be shallow, weak, abnormal, or  difficult. CPAP and BIPAP are most commonly used for obstructive sleep apnea (OSA) to keep the airways from collapsing when the muscles relax during sleep. What are the risks? Generally, this is a safe treatment. However, problems may occur, including: Irritated skin or skin sores if the mask does not fit properly. Dry or stuffy nose or nosebleeds. Dry mouth. Feeling gassy or bloated. Sinus or lung infection if the equipment is not cleaned properly. When should CPAP or BIPAP be used? In most cases, the mask only needs to be worn during sleep. Generally, the mask needs to be worn throughout the night and during any daytime naps. People with certain medical conditions may also need to wear the mask at other times, such as when they are awake. Follow instructions from your health care provider about when to use the machine. What happens during CPAP or BIPAP?  Both CPAP and BIPAP are provided by a small machine with a flexible plastic tube that attaches to a plastic mask that you wear. Air is blown through the mask into your nose or mouth. The amount of pressure that is used to blow the air can be adjusted on the machine. Your health care provider will set the pressure setting and help you find the best mask for you. Tips for using the mask Because the mask needs to be snug, some people feel trapped or closed-in (claustrophobic) when first using the mask. If you feel this way, you may need to get used to the mask. One way to do this is  to hold the mask loosely over your nose or mouth and then gradually apply the mask more snugly. You can also gradually increase the amount of time that you use the mask. Masks are available in various types and sizes. If your mask does not fit well, talk with your health care provider about getting a different one. Some common types of masks include: Full face masks, which fit over the mouth and nose. Nasal masks, which fit over the nose. Nasal pillow or prong masks,  which fit into the nostrils. If you are using a mask that fits over your nose and you tend to breathe through your mouth, a chin strap may be applied to help keep your mouth closed. Use a skin barrier to protect your skin as told by your health care provider. Some CPAP and BIPAP machines have alarms that may sound if the mask comes off or develops a leak. If you have trouble with the mask, it is very important that you talk with your health care provider about finding a way to make the mask easier to tolerate. Do not stop using the mask. There could be a negative impact on your health if you stop using the mask. Tips for using the machine Place your CPAP or BIPAP machine on a secure table or stand near an electrical outlet. Know where the on/off switch is on the machine. Follow instructions from your health care provider about how to set the pressure on your machine and when you should use it. Do not eat or drink while the CPAP or BIPAP machine is on. Food or fluids could get pushed into your lungs by the pressure of the CPAP or BIPAP. For home use, CPAP and BIPAP machines can be rented or purchased through home health care companies. Many different brands of machines are available. Renting a machine before purchasing may help you find out which particular machine works well for you. Your health insurance company may also decide which machine you may get. Keep the CPAP or BIPAP machine and attachments clean. Ask your health care provider for specific instructions. Check the humidifier if you have a dry stuffy nose or nosebleeds. Make sure it is working correctly. Follow these instructions at home: Take over-the-counter and prescription medicines only as told by your health care provider. Ask if you can take sinus medicine if your sinuses are blocked. Do not use any products that contain nicotine or tobacco. These products include cigarettes, chewing tobacco, and vaping devices, such as e-cigarettes.  If you need help quitting, ask your health care provider. Keep all follow-up visits. This is important. Contact a health care provider if: You have redness or pressure sores on your head, face, mouth, or nose from the mask or head gear. You have trouble using the CPAP or BIPAP machine. You cannot tolerate wearing the CPAP or BIPAP mask. Someone tells you that you snore even when wearing your CPAP or BIPAP. Get help right away if: You have trouble breathing. You feel confused. Summary CPAP and BIPAP are methods that use air pressure to keep your airways open and to help you breathe well. If you have trouble with the mask, it is very important that you talk with your health care provider about finding a way to make the mask easier to tolerate. Do not stop using the mask. There could be a negative impact to your health if you stop using the mask. Follow instructions from your health care provider about when to use the  machine. This information is not intended to replace advice given to you by your health care provider. Make sure you discuss any questions you have with your health care provider. Document Revised: 02/08/2021 Document Reviewed: 06/10/2020 Elsevier Patient Education  [employer].
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
## 2023-01-09 NOTE — Progress Notes (Signed)
Reviewed and agree with assessment/plan.   Coralyn Helling, MD [facility] Pulmonary/Critical Care 01/09/2023, 7:10 AM Pager:  [phone]

## 2023-01-18 ENCOUNTER — Other Ambulatory Visit: Payer: Self-pay | Admitting: Family Medicine

## 2023-02-19 ENCOUNTER — Telehealth: Payer: Self-pay | Admitting: Primary Care

## 2023-02-19 ENCOUNTER — Other Ambulatory Visit: Payer: Self-pay | Admitting: Family Medicine

## 2023-02-25 ENCOUNTER — Ambulatory Visit: Payer: PRIVATE HEALTH INSURANCE | Admitting: Family Medicine

## 2023-02-25 ENCOUNTER — Encounter: Payer: Self-pay | Admitting: Family Medicine

## 2023-02-25 VITALS — BP 126/88 | HR 95 | Temp 98.1°F | Ht 66.0 in | Wt 210.8 lb

## 2023-02-25 DIAGNOSIS — Z7985 Long-term (current) use of injectable non-insulin antidiabetic drugs: Secondary | ICD-10-CM

## 2023-02-25 DIAGNOSIS — Z794 Long term (current) use of insulin: Secondary | ICD-10-CM

## 2023-02-25 DIAGNOSIS — E119 Type 2 diabetes mellitus without complications: Secondary | ICD-10-CM | POA: Diagnosis not present

## 2023-02-25 DIAGNOSIS — Z7984 Long term (current) use of oral hypoglycemic drugs: Secondary | ICD-10-CM

## 2023-02-25 LAB — POCT GLYCOSYLATED HEMOGLOBIN (HGB A1C): Hemoglobin A1C: 6.8 % — AB (ref 4.0–5.6)

## 2023-02-25 NOTE — Patient Instructions (Addendum)
Recheck at a yearly visit in ~January 2025.  Labs ahead of time.  Take care.  Glad to see you. Update me as needed.   Thanks for your effort.  Please call about an eye clinic.

## 2023-02-25 NOTE — Telephone Encounter (Signed)
Amy can you call Brad and see if [employer] has used CPAP / patient assistance   Otherwise needs to work on weight loss and get wedge pillow while sleeping

## 2023-02-25 NOTE — Progress Notes (Signed)
Diabetes:  Using medications without difficulties: yes Hypoglycemic episodes:no Hyperglycemic episodes:no Feet problems: no Blood Sugars averaging: usually ~145 average over the last 90 days.   eye exam within last year: due, d/w pt.   A1c 6.8.    Nasal congestion.  Started about 1 week ago, using OTC nasal spray. Attributed to allergies.  Flonase helped.    D/w pt about OSA eval and sleep test.  He waiting to hear from pulmonary clinic about getting supplies set up.    Meds, vitals, and allergies reviewed.   ROS: Per HPI unless specifically indicated in ROS section   GEN: nad, alert and oriented HEENT: ncat NECK: supple w/o LA CV: rrr. PULM: ctab, no inc wob ABD: soft, +bs EXT: no edema SKIN: well perfused.

## 2023-02-27 NOTE — Telephone Encounter (Signed)
Note:  Patient states [employer] told him he had not met deductable for CPAP machine/supplies. Would cost him over $400 a month. Patient cannot afford. Please advise.   Called Nida Boatman New with [employer] per request of Ames Dura, NP.  Nida Boatman will check with the new new rep, Mickeal Needy, [phone], to see if [employer] has any patient assistance programs or used/refurbished CPAP's that patients can get.  Nida Boatman or Marthann Schiller will call us tomorrow with information.  Called patient to let him know we are working on his request.

## 2023-03-07 NOTE — Telephone Encounter (Addendum)
Followed up with Brad at [employer].  He stated he would try to find an email he believes he had regarding [employer] reaching out to patient to discuss low cost options to get patient a CPAP machine.  His co-worker, Marthann Schiller is also working on this.  Will contact us when he gets this information.  Gave patient information and contact information to [place].  Called patient to update him on this.  [employer] will follow up with the patient as well.  Will close this encounter.

## 2023-03-08 NOTE — Telephone Encounter (Signed)
Returned call from [employer] at [employer].  [employer] has a program for patients that are private pay.  It is a CPAP Bundle and [employer] can spread the payments out to around $45/month.  Mitch informed us to call the patient and let him know if Mr. Lierman is interested in this bundle program for private pay customers.  Called patient.  Patient is interested and gave his verbal authorization to move forward to go with [employer] and get his CPAP with [employer]'s private pay CPAP bundle program for approximately $45/month.  Informed Beth of information.  Will let [facility] know of change in DME from [employer] to [employer].

## 2023-03-19 ENCOUNTER — Encounter: Payer: Self-pay | Admitting: Internal Medicine

## 2023-03-19 ENCOUNTER — Ambulatory Visit: Payer: PRIVATE HEALTH INSURANCE | Admitting: Internal Medicine

## 2023-03-19 VITALS — BP 134/88 | HR 94 | Temp 97.8°F | Wt 213.8 lb

## 2023-03-19 DIAGNOSIS — J0100 Acute maxillary sinusitis, unspecified: Secondary | ICD-10-CM

## 2023-03-19 MED ORDER — AMOXICILLIN-POT CLAVULANATE 875-125 MG PO TABS: 1.0000 | ORAL_TABLET | Freq: Two times a day (BID) | ORAL | 0 refills | Status: DC
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
## 2023-03-19 NOTE — Assessment & Plan Note (Signed)
Seems to be bacterial sinus Unclear if middle ear involvement--symptoms may just be cerumen Analgesics, decongestants if they help Augmentin 875 bid x 7 days

## 2023-04-16 ENCOUNTER — Other Ambulatory Visit: Payer: Self-pay | Admitting: Family Medicine

## 2023-05-03 ENCOUNTER — Telehealth: Payer: Self-pay | Admitting: Family Medicine

## 2023-05-03 MED ORDER — OMEPRAZOLE MAGNESIUM 20 MG PO TBEC: 20.0000 mg | DELAYED_RELEASE_TABLET | Freq: Every day | ORAL | 0 refills | Status: DC | PRN

## 2023-05-03 MED ORDER — DAPAGLIFLOZIN PROPANEDIOL 5 MG PO TABS: 5.0000 mg | ORAL_TABLET | Freq: Every day | ORAL | 3 refills | Status: DC

## 2023-05-03 NOTE — Telephone Encounter (Signed)
Rx sent electronically 

## 2023-05-03 NOTE — Telephone Encounter (Signed)
Noted. Thanks.

## 2023-05-03 NOTE — Telephone Encounter (Signed)
Patient called in and stated that he going out of town and will run out before getting back. Thank you!

## 2023-05-03 NOTE — Telephone Encounter (Signed)
Prescription Request  05/03/2023  LOV: 02/25/2023  What is the name of the medication or equipment? dapagliflozin propanediol (FARXIGA) 5 MG TABS tablet omeprazole (PRILOSEC OTC) 20 MG tablet   Have you contacted your pharmacy to request a refill? Yes   Which pharmacy would you like this sent to?  [facility] - [place], [place] - [address] [place] [address] Phone: [phone] Fax: [phone]    Patient notified that their request is being sent to the clinical staff for review and that they should receive a response within 2 business days.   Please advise at Mobile [phone] (mobile)

## 2023-05-14 LAB — HM DIABETES EYE EXAM

## 2023-05-17 ENCOUNTER — Telehealth: Payer: Self-pay | Admitting: Family Medicine

## 2023-05-17 NOTE — Telephone Encounter (Signed)
Prescription Request  05/17/2023  LOV: 02/25/2023  What is the name of the medication or equipment?  LANTUS SOLOSTAR 100 UNIT/ML Solostar Pen  Have you contacted your pharmacy to request a refill? Yes   Which pharmacy would you like this sent to?  [facility] - [place] - [address] [place] [address] Phone: [phone] Fax: [phone]    Patient notified that their request is being sent to the clinical staff for review and that they should receive a response within 2 business days.   Please advise at Mobile [phone] (mobile)

## 2023-05-20 MED ORDER — LANTUS SOLOSTAR 100 UNIT/ML ~~LOC~~ SOPN: PEN_INJECTOR | SUBCUTANEOUS | 2 refills | Status: DC

## 2023-05-20 NOTE — Telephone Encounter (Signed)
 Refilled sent as requested

## 2023-07-21 ENCOUNTER — Other Ambulatory Visit: Payer: Self-pay | Admitting: Family Medicine

## 2023-07-21 DIAGNOSIS — E119 Type 2 diabetes mellitus without complications: Secondary | ICD-10-CM

## 2023-07-25 ENCOUNTER — Other Ambulatory Visit (INDEPENDENT_AMBULATORY_CARE_PROVIDER_SITE_OTHER): Payer: PRIVATE HEALTH INSURANCE

## 2023-07-25 ENCOUNTER — Telehealth: Payer: Self-pay

## 2023-07-25 DIAGNOSIS — E119 Type 2 diabetes mellitus without complications: Secondary | ICD-10-CM | POA: Diagnosis not present

## 2023-07-25 LAB — COMPREHENSIVE METABOLIC PANEL
ALT: 27 U/L (ref 0–53)
AST: 22 U/L (ref 0–37)
Albumin: 4.5 g/dL (ref 3.5–5.2)
Alkaline Phosphatase: 56 U/L (ref 39–117)
BUN: 16 mg/dL (ref 6–23)
CO2: 27 meq/L (ref 19–32)
Calcium: 9.4 mg/dL (ref 8.4–10.5)
Chloride: 104 meq/L (ref 96–112)
Creatinine, Ser: 0.94 mg/dL (ref 0.40–1.50)
GFR: 94.86 mL/min (ref >60.00)
Glucose, Bld: 41 mg/dL — CL (ref 70–99)
Potassium: 3.9 meq/L (ref 3.5–5.1)
Sodium: 138 meq/L (ref 135–145)
Total Bilirubin: 0.3 mg/dL (ref 0.2–1.2)
Total Protein: 7.3 g/dL (ref 6.0–8.3)

## 2023-07-25 LAB — HEMOGLOBIN A1C: Hgb A1c MFr Bld: 7.5 % — ABNORMAL HIGH (ref 4.6–6.5)

## 2023-07-25 LAB — LIPID PANEL
Cholesterol: 258 mg/dL — ABNORMAL HIGH (ref 0–200)
HDL: 35.6 mg/dL — ABNORMAL LOW (ref >39.00)
NonHDL: 222.65
Total CHOL/HDL Ratio: 7
Triglycerides: 1715 mg/dL — ABNORMAL HIGH (ref 0.0–149.0)
VLDL: 343 mg/dL — ABNORMAL HIGH (ref 0.0–40.0)

## 2023-07-25 LAB — MICROALBUMIN / CREATININE URINE RATIO
Creatinine,U: 101.4 mg/dL
Microalb Creat Ratio: 0.7 mg/g (ref 0.0–30.0)
Microalb, Ur: <0.7 mg/dL (ref 0.0–1.9)

## 2023-07-25 LAB — LDL CHOLESTEROL, DIRECT: Direct LDL: 75 mg/dL

## 2023-07-25 NOTE — Telephone Encounter (Signed)
 Dr Cleatus notified of critical lab; I spoke with pt; pt said he felt OK; pt said he took lantus  insulin  60 units and farziga 5 mg at 7 AM. Pt last took ozempic  2 mg on 07/19/23. Pt said he had fasting lab work today around 8 AM so he did not eat breakfast until after blood draw and pt ate bojangles biscuit, bo rounds and drink after had blood drawn. At 12:30 pm pt ate barbeque sandwich and mac & cheese. .pt had been fasting prior to having blood drawn since 07/24/23 at 8 pm. Pt has had no symptoms of low blood sugar this morning.  I advised pt if delaying breakfast not to take diabetic meds until able to eat afterwards. Pt will drink plenty of water today. Now BS 220. UC & ED precautions given and pt voiced understanding,. Sending note to Dr Cleatus. Pt request cb after note and labs reviewed by Dr Cleatus.

## 2023-07-25 NOTE — Telephone Encounter (Signed)
 Noted. Thanks.

## 2023-07-25 NOTE — Telephone Encounter (Signed)
 CRITICAL VALUE STICKER  CRITICAL VALUE:Blood sugar 41  RECEIVER (on-site recipient of call):Dyllan Hughett LPN  DATE & TIME NOTIFIED: 12:49 PM 07/25/23   MESSENGER (representative from lab):[email or web address]  MD NOTIFIED: Dr. Arlyss Solian  TIME OF NOTIFICATION:12:50 pm  RESPONSE:

## 2023-08-01 ENCOUNTER — Ambulatory Visit (INDEPENDENT_AMBULATORY_CARE_PROVIDER_SITE_OTHER): Payer: PRIVATE HEALTH INSURANCE | Admitting: Family Medicine

## 2023-08-01 ENCOUNTER — Encounter: Payer: Self-pay | Admitting: Family Medicine

## 2023-08-01 VITALS — BP 114/74 | HR 91 | Temp 97.8°F | Ht 66.54 in | Wt 214.4 lb

## 2023-08-01 DIAGNOSIS — Z85820 Personal history of malignant melanoma of skin: Secondary | ICD-10-CM

## 2023-08-01 DIAGNOSIS — Z0000 Encounter for general adult medical examination without abnormal findings: Secondary | ICD-10-CM

## 2023-08-01 DIAGNOSIS — Z7189 Other specified counseling: Secondary | ICD-10-CM

## 2023-08-01 DIAGNOSIS — E119 Type 2 diabetes mellitus without complications: Secondary | ICD-10-CM

## 2023-08-01 DIAGNOSIS — Z1211 Encounter for screening for malignant neoplasm of colon: Secondary | ICD-10-CM

## 2023-08-01 DIAGNOSIS — E782 Mixed hyperlipidemia: Secondary | ICD-10-CM

## 2023-08-01 MED ORDER — LANTUS SOLOSTAR 100 UNIT/ML ~~LOC~~ SOPN: PEN_INJECTOR | SUBCUTANEOUS | Status: DC

## 2023-08-01 MED ORDER — SIMVASTATIN 40 MG PO TABS: 40.0000 mg | ORAL_TABLET | Freq: Every day | ORAL | 3 refills | Status: DC

## 2023-08-01 NOTE — Progress Notes (Signed)
CPE- See plan.  Routine anticipatory guidance given to patient.  See health maintenance.  The possibility exists that previously documented standard health maintenance information may have been brought forward from a previous encounter into this note.  If needed, that same information has been updated to reflect the current situation based on today's encounter.    Tetanus 2020 Flu 2024- September 2024 PNA 2015 Shingles d/w pt.   Covid prev done.  prostate cancer screening not due D/w patient [id number] for colon cancer screening, including IFOB vs. Colonoscopy vs cologuard.  Risks and benefits of both were discussed and patient voiced understanding.  Pt elects for cologuard.  Living will d/w pt.  Wife designated if patient were incapacitated.   D&E d/w pt, esp with TG elevation.   Diabetes:  Using medications without difficulties: yes Hypoglycemic episodes: see notes WU:[id number] labs, cautions d/w pt about prolonged fasting- he isn't having lows o/w.   Hyperglycemic episodes:no Feet problems:no Blood Sugars averaging: ~160 eye exam within last year: yes Labs d/w pt.    He is still seeing dermatology.    Elevated Cholesterol: Using medications without problems:yes Muscle aches: no Diet compliance: d/w pt.  Exercise: d/w pt.  Labs d/w pt.   He is going to see the [employer] and [employer] play in a spring training game, d/w pt.    PMH and SH reviewed  Meds, vitals, and allergies reviewed.   ROS: Per HPI.  Unless specifically indicated otherwise in HPI, the patient denies:  General: fever. Eyes: acute vision changes ENT: sore throat Cardiovascular: chest pain Respiratory: SOB GI: vomiting GU: dysuria Musculoskeletal: acute back pain Derm: acute rash Neuro: acute motor dysfunction Psych: worsening mood Endocrine: polydipsia Heme: bleeding Allergy: hayfever  GEN: nad, alert and oriented HEENT: ncat NECK: supple w/o LA CV: rrr. PULM: ctab, no inc wob ABD: soft, +bs EXT: no  edema SKIN: well perfused.

## 2023-08-01 NOTE — Patient Instructions (Addendum)
Recheck labs prior to a visit in about 3 months.  Take care.  Glad to see you. Keep working on diet and cut back on bread in the meantime.

## 2023-08-04 NOTE — Assessment & Plan Note (Signed)
He is still seeing dermatology.  I'll defer.  No new lesions.

## 2023-08-04 NOTE — Assessment & Plan Note (Signed)
I want to recheck his labs in 3 months.  Discussed diet.  Continue simvastatin for now.

## 2023-08-04 NOTE — Assessment & Plan Note (Signed)
Living will d/w pt.  Wife designated if patient were incapacitated.   ?

## 2023-08-04 NOTE — Assessment & Plan Note (Signed)
Tetanus 2020 Flu 2024- September 2024 PNA 2015 Shingles d/w pt.   Covid prev done.  prostate cancer screening not due D/w patient [id number] for colon cancer screening, including IFOB vs. Colonoscopy vs cologuard.  Risks and benefits of both were discussed and patient voiced understanding.  Pt elects for cologuard.  Living will d/w pt.  Wife designated if patient were incapacitated.   D&E d/w pt, esp with TG elevation.

## 2023-08-04 NOTE — Assessment & Plan Note (Signed)
Recheck labs prior to a visit in about 3 months.  Did not increase insulin given concern for potentially inducing hypoglycemia.  Cautions discussed with patient. I asked him to keep working on diet and cut back on bread/carbs in the meantime.

## 2023-08-08 ENCOUNTER — Other Ambulatory Visit: Payer: Self-pay | Admitting: Family Medicine

## 2023-08-08 NOTE — Telephone Encounter (Unsigned)
Copied from CRM [id number]. Topic: Clinical - Medication Refill >> Aug 08, 2023 10:34 AM Deaijah H wrote: Most Recent Primary Care Visit:  Provider: Joaquim Nam  Department: [facility]  Visit Type: PHYSICAL  Date: 08/01/2023  Medication: omeprazole (PRILOSEC) 20 MG capsule/insulin glargine (LANTUS SOLOSTAR) 100 UNIT/ML Solostar Pen  Has the patient contacted their pharmacy? Yes (Agent: If no, request that the patient contact the pharmacy for the refill. If patient does not wish to contact the pharmacy document the reason why and proceed with request.) (Agent: If yes, when and what did the pharmacy advise?) - Yes was advised no refills at pharmacy and that they reached out to Dr. Para March multiple times regarding request with no response  Is this the correct pharmacy for this prescription? Yes If no, delete pharmacy and type the correct one.  This is the patient's preferred pharmacy:  [facility] - [place], [place] - [address] [place] [address] Phone: [phone] Fax: [phone]   Has the prescription been filled recently? No  Is the patient out of the medication? No  Has the patient been seen for an appointment in the last year OR does the patient have an upcoming appointment? Yes  Can we respond through MyChart? Yes  Agent: Please be advised that Rx refills may take up to 3 business days. We ask that you follow-up with your pharmacy.

## 2023-08-09 ENCOUNTER — Other Ambulatory Visit: Payer: Self-pay | Admitting: Family Medicine

## 2023-08-09 MED ORDER — LANTUS SOLOSTAR 100 UNIT/ML ~~LOC~~ SOPN: PEN_INJECTOR | SUBCUTANEOUS | 3 refills | Status: DC

## 2023-08-09 NOTE — Telephone Encounter (Signed)
Copied from CRM [id number]. Topic: Clinical - Medication Refill >> Aug 09, 2023  8:47 AM Isabell A wrote: Most Recent Primary Care Visit:  Provider: Joaquim Nam  Department: [facility]  Visit Type: PHYSICAL  Date: 08/01/2023  Medication: insulin glargine (LANTUS SOLOSTAR) 100 UNIT/ML Solostar Pen  Has the patient contacted their pharmacy? No (Agent: If no, request that the patient contact the pharmacy for the refill. If patient does not wish to contact the pharmacy document the reason why and proceed with request.) (Agent: If yes, when and what did the pharmacy advise?)  Is this the correct pharmacy for this prescription? Yes If no, delete pharmacy and type the correct one.  This is the patient's preferred pharmacy:  [facility] - [place], [place] - [address] [place] [address] Phone: [phone] Fax: [phone]   Has the prescription been filled recently? Yes  Is the patient out of the medication? No  Has the patient been seen for an appointment in the last year OR does the patient have an upcoming appointment? Yes  Can we respond through MyChart? No  Agent: Please be advised that Rx refills may take up to 3 business days. We ask that you follow-up with your pharmacy.

## 2023-08-16 ENCOUNTER — Other Ambulatory Visit: Payer: Self-pay | Admitting: Family Medicine

## 2023-08-16 NOTE — Telephone Encounter (Signed)
Copied from CRM [id number]. Topic: Clinical - Medication Refill >> Aug 16, 2023  4:30 PM Eunice Blase wrote: Most Recent Primary Care Visit:  Provider: Joaquim Nam  Department: [facility]  Visit Type: PHYSICAL  Date: 08/01/2023  Medication: OZEMPIC, 2 MG/DOSE, 8 MG/3ML SOPN  Has the patient contacted their pharmacy? Yes (Agent: If no, request that the patient contact the pharmacy for the refill. If patient does not wish to contact the pharmacy document the reason why and proceed with request.) (Agent: If yes, when and what did the pharmacy advise?)  Is this the correct pharmacy for this prescription? Yes If no, delete pharmacy and type the correct one.  This is the patient's preferred pharmacy:  [facility] - [place], [place] - [address] [place] [address] Phone: [phone] Fax: [phone]   Has the prescription been filled recently? Yes  Is the patient out of the medication? Yes  Has the patient been seen for an appointment in the last year OR does the patient have an upcoming appointment? Yes  Can we respond through MyChart? Yes  Agent: Please be advised that Rx refills may take up to 3 business days. We ask that you follow-up with your pharmacy.

## 2023-08-16 NOTE — Telephone Encounter (Signed)
Last Fill: 04/16/23  Last OV: 08/01/23 Next OV: 11/05/23  Routing to provider for review/authorization.

## 2023-08-18 NOTE — Telephone Encounter (Signed)
 Sent. Thanks.

## 2023-09-19 ENCOUNTER — Ambulatory Visit (INDEPENDENT_AMBULATORY_CARE_PROVIDER_SITE_OTHER): Payer: PRIVATE HEALTH INSURANCE | Admitting: Internal Medicine

## 2023-09-19 ENCOUNTER — Encounter: Payer: Self-pay | Admitting: Internal Medicine

## 2023-09-19 VITALS — BP 124/88 | HR 82 | Temp 97.8°F | Ht 66.5 in | Wt 217.0 lb

## 2023-09-19 DIAGNOSIS — J0140 Acute pansinusitis, unspecified: Secondary | ICD-10-CM | POA: Insufficient documentation

## 2023-09-19 MED ORDER — AMOXICILLIN-POT CLAVULANATE 875-125 MG PO TABS: 1.0000 | ORAL_TABLET | Freq: Two times a day (BID) | ORAL | 0 refills | Status: DC
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
## 2023-09-19 NOTE — Assessment & Plan Note (Signed)
 Seems to have another bacterial infection now Discussed nasal spray and analgesics Augmentin 875 bid x 7 days

## 2023-10-04 LAB — COLOGUARD

## 2023-10-24 LAB — COLOGUARD: COLOGUARD: NEGATIVE

## 2023-10-27 ENCOUNTER — Encounter: Payer: Self-pay | Admitting: Family Medicine

## 2023-10-29 ENCOUNTER — Other Ambulatory Visit (INDEPENDENT_AMBULATORY_CARE_PROVIDER_SITE_OTHER): Payer: PRIVATE HEALTH INSURANCE

## 2023-10-29 DIAGNOSIS — E119 Type 2 diabetes mellitus without complications: Secondary | ICD-10-CM

## 2023-10-29 LAB — LDL CHOLESTEROL, DIRECT: Direct LDL: 83 mg/dL

## 2023-10-29 LAB — LIPID PANEL
Cholesterol: 186 mg/dL (ref 0–200)
HDL: 36.2 mg/dL — ABNORMAL LOW (ref >39.00)
NonHDL: 149.65
Total CHOL/HDL Ratio: 5
Triglycerides: 452 mg/dL — ABNORMAL HIGH (ref 0.0–149.0)
VLDL: 90.4 mg/dL — ABNORMAL HIGH (ref 0.0–40.0)

## 2023-10-29 LAB — HEMOGLOBIN A1C: Hgb A1c MFr Bld: 7.3 % — ABNORMAL HIGH (ref 4.6–6.5)

## 2023-11-05 ENCOUNTER — Ambulatory Visit: Payer: PRIVATE HEALTH INSURANCE | Admitting: Family Medicine

## 2023-11-05 ENCOUNTER — Encounter: Payer: Self-pay | Admitting: Family Medicine

## 2023-11-05 VITALS — BP 128/82 | HR 62 | Temp 98.1°F | Ht 66.5 in | Wt 215.0 lb

## 2023-11-05 DIAGNOSIS — Z7985 Long-term (current) use of injectable non-insulin antidiabetic drugs: Secondary | ICD-10-CM | POA: Diagnosis not present

## 2023-11-05 DIAGNOSIS — Z794 Long term (current) use of insulin: Secondary | ICD-10-CM | POA: Diagnosis not present

## 2023-11-05 DIAGNOSIS — E119 Type 2 diabetes mellitus without complications: Secondary | ICD-10-CM | POA: Diagnosis not present

## 2023-11-05 DIAGNOSIS — Z7984 Long term (current) use of oral hypoglycemic drugs: Secondary | ICD-10-CM | POA: Diagnosis not present

## 2023-11-05 MED ORDER — OMEPRAZOLE 20 MG PO CPDR: 20.0000 mg | DELAYED_RELEASE_CAPSULE | Freq: Every day | ORAL | 3 refills | Status: AC | PRN

## 2023-11-05 MED ORDER — LANTUS SOLOSTAR 100 UNIT/ML ~~LOC~~ SOPN: PEN_INJECTOR | SUBCUTANEOUS | Status: DC

## 2023-11-05 NOTE — Patient Instructions (Addendum)
 Recheck A1c in about 4 months at a visit.  If you are missing a lot of evening simvastatin  doses, then change to AM dosing.  Take care.  Glad to see you.

## 2023-11-05 NOTE — Progress Notes (Signed)
 Cologuard neg.  D/w pt.    Diabetes:  Using medications without difficulties: yes Hypoglycemic episodes:no Hyperglycemic episodes:no Feet problems: no Blood Sugars averaging: usually ~120-140s prior to eating.  eye exam within last year: yes A1c some better at 7.3. prev 7.5.  Prev MALB d/w pt, ratio still wnl.  No change in plan.   2mg  ozempic .  Insulin  lower dose now- down to 60 units.  On farxiga .   He has been working on diet, d/w pt.    High TG but better than prior. He had missed some doses of simvastatin , d/w pt.  Already taking fish oil.    Meds, vitals, and allergies reviewed.   ROS: Per HPI unless specifically indicated in ROS section   GEN: nad, alert and oriented HEENT: ncat NECK: supple w/o LA CV: rrr. PULM: ctab, no inc wob ABD: soft, +bs EXT: no edema SKIN: no acute rash  Diabetic foot exam: Normal inspection except for small area of cracked skin that is healing on the L foot, plantar side, d/w pt changing socks given his tendency to sweat.   No skin breakdown No calluses  Normal DP pulses Normal sensation to light touch and monofilament Nails normal

## 2023-11-06 NOTE — Assessment & Plan Note (Signed)
 Discussed options. A1c some better at 7.3. prev 7.5.  Prev MALB d/w pt, ratio still wnl.  No change in plan.   2mg  ozempic .  Insulin  lower dose now- down to 60 units.  On farxiga .   He has been working on diet, d/w pt.   No change in medications but continue work on diet and exercise. Recheck A1c in about 4 months at a visit.  If missing a lot of evening simvastatin  doses, then change to AM dosing daily.

## 2024-03-06 ENCOUNTER — Encounter: Payer: Self-pay | Admitting: Family Medicine

## 2024-03-06 ENCOUNTER — Ambulatory Visit (INDEPENDENT_AMBULATORY_CARE_PROVIDER_SITE_OTHER): Payer: PRIVATE HEALTH INSURANCE | Admitting: Family Medicine

## 2024-03-06 VITALS — BP 126/82 | HR 89 | Temp 98.0°F | Ht 66.5 in | Wt 213.1 lb

## 2024-03-06 DIAGNOSIS — E119 Type 2 diabetes mellitus without complications: Secondary | ICD-10-CM

## 2024-03-06 LAB — POCT GLYCOSYLATED HEMOGLOBIN (HGB A1C): Hemoglobin A1C: 6.9 % — AB (ref 4.0–5.6)

## 2024-03-06 LAB — MICROALBUMIN / CREATININE URINE RATIO
Creatinine,U: 136.8 mg/dL
Microalb Creat Ratio: 5.3 mg/g (ref 0.0–30.0)
Microalb, Ur: 0.7 mg/dL (ref 0.0–1.9)

## 2024-03-06 MED ORDER — DAPAGLIFLOZIN PROPANEDIOL 5 MG PO TABS: 5.0000 mg | ORAL_TABLET | Freq: Every day | ORAL | 3 refills | Status: DC

## 2024-03-06 NOTE — Patient Instructions (Addendum)
 Go to the lab on the way out.   If you have mychart we'll likely use that to update you.    Take care.  Glad to see you. I would get a flu shot each fall.    Recheck at a yearly visit in around January 2026.  Labs at the visit.

## 2024-03-06 NOTE — Progress Notes (Signed)
 Diabetes:  Using medications without difficulties:yes Hypoglycemic episodes: no Hyperglycemic episodes: no Feet problems: no Blood Sugars averaging: usually 120-160s recently.   eye exam within last year:yes A1c d/w pt at OV.   Improved.  He has been working on diet and exercise.    Meds, vitals, and allergies reviewed.   ROS: Per HPI unless specifically indicated in ROS section   GEN: nad, alert and oriented HEENT: ncat NECK: supple w/o LA CV: rrr. PULM: ctab, no inc wob ABD: soft, +bs EXT: no edema SKIN: well perfused.   Diabetic foot exam: Normal inspection No skin breakdown Small callus R 1st toe, no ulceration.   Normal DP pulses Normal sensation to light touch and monofilament Nails normal

## 2024-03-08 ENCOUNTER — Ambulatory Visit: Payer: Self-pay | Admitting: Family Medicine

## 2024-03-08 NOTE — Assessment & Plan Note (Signed)
 A1c d/w pt at OV.   Improved.  He has been working on diet and exercise.   See notes on labs. Recheck at a yearly visit in around January 2026.  Labs at the visit.  No change in meds at this point.  Continue Farxiga  insulin  and Ozempic .

## 2024-04-16 ENCOUNTER — Telehealth: Payer: Self-pay

## 2024-04-16 ENCOUNTER — Encounter: Payer: Self-pay | Admitting: Family Medicine

## 2024-04-16 NOTE — Telephone Encounter (Signed)
 Copied from CRM #[id number]. Topic: Clinical - Medication Question >> Apr 16, 2024  7:42 AM Deaijah H wrote: Reason for CRM: Patient would like to know if Dr. Cleatus would put him on Freestyle Libre 3+ Sensor. Believes insurance would cover and it would be cheaper than what he has now. Please call [phone] w/ confirmation

## 2024-04-16 NOTE — Telephone Encounter (Signed)
 Please send the rx for freestyle libre 3+.  Dx E11.9, treated with insulin .  Thanks.

## 2024-04-17 ENCOUNTER — Other Ambulatory Visit: Payer: Self-pay

## 2024-04-17 DIAGNOSIS — E119 Type 2 diabetes mellitus without complications: Secondary | ICD-10-CM

## 2024-04-17 MED ORDER — FREESTYLE LIBRE 3 PLUS SENSOR MISC: 2 refills | Status: DC

## 2024-04-17 NOTE — Telephone Encounter (Signed)
 Called patient to advise that rx has been sent. Unable to leave a vm due to mailbox full. I will send a my chart message.

## 2024-04-19 NOTE — Telephone Encounter (Signed)
 Please send the order for the patient to get a Freestyle Libre3.  Diagnosis E11.9, insulin  treated.  Thanks.

## 2024-05-14 LAB — OPHTHALMOLOGY REPORT-SCANNED

## 2024-05-26 ENCOUNTER — Other Ambulatory Visit: Payer: Self-pay | Admitting: Family Medicine

## 2024-05-29 ENCOUNTER — Other Ambulatory Visit: Payer: Self-pay | Admitting: Family Medicine

## 2024-08-04 ENCOUNTER — Ambulatory Visit (INDEPENDENT_AMBULATORY_CARE_PROVIDER_SITE_OTHER): Payer: PRIVATE HEALTH INSURANCE | Admitting: Family Medicine

## 2024-08-04 ENCOUNTER — Encounter: Payer: Self-pay | Admitting: Family Medicine

## 2024-08-04 ENCOUNTER — Other Ambulatory Visit: Payer: Self-pay | Admitting: Family Medicine

## 2024-08-04 VITALS — BP 130/88 | HR 86 | Temp 97.7°F | Ht 66.0 in | Wt 207.1 lb

## 2024-08-04 DIAGNOSIS — E782 Mixed hyperlipidemia: Secondary | ICD-10-CM

## 2024-08-04 DIAGNOSIS — E785 Hyperlipidemia, unspecified: Secondary | ICD-10-CM | POA: Diagnosis not present

## 2024-08-04 DIAGNOSIS — E119 Type 2 diabetes mellitus without complications: Secondary | ICD-10-CM | POA: Diagnosis not present

## 2024-08-04 DIAGNOSIS — Z7984 Long term (current) use of oral hypoglycemic drugs: Secondary | ICD-10-CM

## 2024-08-04 DIAGNOSIS — Z0000 Encounter for general adult medical examination without abnormal findings: Secondary | ICD-10-CM

## 2024-08-04 DIAGNOSIS — Z85820 Personal history of malignant melanoma of skin: Secondary | ICD-10-CM

## 2024-08-04 DIAGNOSIS — Z7189 Other specified counseling: Secondary | ICD-10-CM

## 2024-08-04 DIAGNOSIS — Z7985 Long-term (current) use of injectable non-insulin antidiabetic drugs: Secondary | ICD-10-CM

## 2024-08-04 LAB — LIPID PANEL
Cholesterol: 188 mg/dL (ref 28–200)
HDL: 38.3 mg/dL — ABNORMAL LOW
LDL Cholesterol: 83 mg/dL (ref 10–99)
NonHDL: 149.65
Total CHOL/HDL Ratio: 5
Triglycerides: 331 mg/dL — ABNORMAL HIGH (ref 10.0–149.0)
VLDL: 66.2 mg/dL — ABNORMAL HIGH (ref 0.0–40.0)

## 2024-08-04 LAB — CBC WITH DIFFERENTIAL/PLATELET
Basophils Absolute: 0 K/uL (ref 0.0–0.1)
Basophils Relative: 0.5 % (ref 0.0–3.0)
Eosinophils Absolute: 0.1 K/uL (ref 0.0–0.7)
Eosinophils Relative: 1.8 % (ref 0.0–5.0)
HCT: 45.8 % (ref 39.0–52.0)
Hemoglobin: 15.7 g/dL (ref 13.0–17.0)
Lymphocytes Relative: 34.5 % (ref 12.0–46.0)
Lymphs Abs: 2.6 K/uL (ref 0.7–4.0)
MCHC: 34.3 g/dL (ref 30.0–36.0)
MCV: 87.3 fl (ref 78.0–100.0)
Monocytes Absolute: 0.5 K/uL (ref 0.1–1.0)
Monocytes Relative: 6.4 % (ref 3.0–12.0)
Neutro Abs: 4.2 K/uL (ref 1.4–7.7)
Neutrophils Relative %: 56.8 % (ref 43.0–77.0)
Platelets: 229 K/uL (ref 150.0–400.0)
RBC: 5.25 Mil/uL (ref 4.22–5.81)
RDW: 12.9 % (ref 11.5–15.5)
WBC: 7.4 K/uL (ref 4.0–10.5)

## 2024-08-04 LAB — COMPREHENSIVE METABOLIC PANEL WITH GFR
ALT: 21 U/L (ref 3–53)
AST: 15 U/L (ref 5–37)
Albumin: 4.3 g/dL (ref 3.5–5.2)
Alkaline Phosphatase: 42 U/L (ref 39–117)
BUN: 10 mg/dL (ref 6–23)
CO2: 29 meq/L (ref 19–32)
Calcium: 9.3 mg/dL (ref 8.4–10.5)
Chloride: 105 meq/L (ref 96–112)
Creatinine, Ser: 0.76 mg/dL (ref 0.40–1.50)
GFR: 104.42 mL/min
Glucose, Bld: 76 mg/dL (ref 70–99)
Potassium: 4 meq/L (ref 3.5–5.1)
Sodium: 139 meq/L (ref 135–145)
Total Bilirubin: 0.5 mg/dL (ref 0.2–1.2)
Total Protein: 7.2 g/dL (ref 6.0–8.3)

## 2024-08-04 LAB — HEMOGLOBIN A1C: Hgb A1c MFr Bld: 7.6 % — ABNORMAL HIGH (ref 4.6–6.5)

## 2024-08-04 MED ORDER — LANTUS SOLOSTAR 100 UNIT/ML ~~LOC~~ SOPN: PEN_INJECTOR | SUBCUTANEOUS | Status: DC

## 2024-08-04 MED ORDER — SIMVASTATIN 40 MG PO TABS: 40.0000 mg | ORAL_TABLET | Freq: Every day | ORAL | 3 refills | Status: AC

## 2024-08-04 MED ORDER — OZEMPIC (2 MG/DOSE) 8 MG/3ML ~~LOC~~ SOPN: 2.0000 mg | PEN_INJECTOR | SUBCUTANEOUS | 12 refills | Status: AC

## 2024-08-04 MED ORDER — DAPAGLIFLOZIN PROPANEDIOL 5 MG PO TABS: 5.0000 mg | ORAL_TABLET | Freq: Every day | ORAL | 3 refills | Status: AC

## 2024-08-04 MED ORDER — LANTUS SOLOSTAR 100 UNIT/ML ~~LOC~~ SOPN: PEN_INJECTOR | SUBCUTANEOUS | 12 refills | Status: AC

## 2024-08-04 NOTE — Assessment & Plan Note (Signed)
 Living will d/w pt.  Wife designated if patient were incapacitated.   ?

## 2024-08-04 NOTE — Assessment & Plan Note (Signed)
 Labs pending, continue simvastatin .  Continue work on diet and exercise.

## 2024-08-04 NOTE — Assessment & Plan Note (Signed)
 Requesting dermatology records.

## 2024-08-04 NOTE — Patient Instructions (Addendum)
 Go to the lab on the way out.   If you have mychart we'll likely use that to update you.    Please ask the front for a record release for dermatology (Dr. Bonney) for the last year.  Take care.  Glad to see you. Recheck in about 6 months.  A1c at the visit.

## 2024-08-04 NOTE — Assessment & Plan Note (Signed)
 Tetanus 2020 Flu 2025 at work, 04/14/2024 PNA 2015 Shingles d/w pt.   Covid prev done.  Prostate cancer screening not due Cologuard neg 2025 Living will d/w pt.  Wife designated if patient were incapacitated.   D&E d/w pt.

## 2024-08-04 NOTE — Assessment & Plan Note (Signed)
 Intentional weight loss.  Labs pending.  No change in farxiga  and ozempic .   Recheck periodically.   Continue work on diet and exercise.

## 2024-08-04 NOTE — Progress Notes (Signed)
 CPE- See plan.  Routine anticipatory guidance given to patient.  See health maintenance.  The possibility exists that previously documented standard health maintenance information may have been brought forward from a previous encounter into this note.  If needed, that same information has been updated to reflect the current situation based on today's encounter.     Tetanus 2020 Flu 2025 at work, 04/14/2024 PNA 2015 Shingles d/w pt.   Covid prev done.  Prostate cancer screening not due Cologuard neg 2025 Living will d/w pt.  Wife designated if patient were incapacitated.   D&E d/w pt.   Diabetes:  Using medications without difficulties: yes Hypoglycemic episodes: rare lows, cautions d/w pt about prolonged fasting- he isn't having lows o/w.   Hyperglycemic episodes:no Feet problems:no tingling but still has dry skin.   He had scraped the R heel but that is healing.  Blood Sugars averaging: usually in range based on CGM eye exam within last year: yes Labs pending.    He is still seeing dermatology.  Requesting records.     Elevated Cholesterol: Using medications without problems:yes Muscle aches: no Diet compliance: d/w pt.  Exercise: d/w pt.  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  He feels good about it.  I thanked him for his effort.    Meds, vitals, and allergies reviewed.   ROS: Per HPI unless specifically indicated in ROS section   GEN: nad, alert and oriented HEENT: mucous membranes moist NECK: supple w/o LA CV: rrr.  no murmur PULM: ctab, no inc wob ABD: soft, +bs EXT: no edema SKIN: no acute rash  Diabetic foot exam: Normal inspection No skin breakdown B 1st toe calluses  Normal DP pulses Normal sensation to light touch and monofilament Nails normal

## 2024-08-05 ENCOUNTER — Ambulatory Visit: Payer: Self-pay | Admitting: Family Medicine

## 2025-02-01 ENCOUNTER — Ambulatory Visit: Admitting: Family Medicine
